# Patient Record
Sex: Female | Born: 1955 | Race: White | Hispanic: No | Marital: Married | Smoking: Current some day smoker
Health system: Southern US, Community
[De-identification: ages and names within clinical notes are randomized; demographics above are authoritative.]

## PROBLEM LIST (undated history)

## (undated) DIAGNOSIS — F419 Anxiety disorder, unspecified: Secondary | ICD-10-CM

## (undated) DIAGNOSIS — F32A Depression, unspecified: Secondary | ICD-10-CM

## (undated) DIAGNOSIS — F329 Major depressive disorder, single episode, unspecified: Secondary | ICD-10-CM

## (undated) HISTORY — PX: BREAST CYST ASPIRATION: SHX578

## (undated) HISTORY — PX: BREAST BIOPSY: SHX20

## (undated) HISTORY — PX: TUBAL LIGATION: SHX77

---

## 2006-03-19 ENCOUNTER — Emergency Department: Payer: Self-pay

## 2006-09-08 ENCOUNTER — Ambulatory Visit: Payer: Self-pay

## 2008-04-26 ENCOUNTER — Ambulatory Visit: Payer: Self-pay | Admitting: Internal Medicine

## 2008-06-14 ENCOUNTER — Ambulatory Visit: Payer: Self-pay | Admitting: Internal Medicine

## 2008-07-23 ENCOUNTER — Ambulatory Visit: Payer: Self-pay | Admitting: Emergency Medicine

## 2008-09-17 ENCOUNTER — Ambulatory Visit: Payer: Self-pay | Admitting: Family Medicine

## 2008-09-19 ENCOUNTER — Ambulatory Visit: Payer: Self-pay | Admitting: Family Medicine

## 2011-07-30 ENCOUNTER — Ambulatory Visit: Payer: Self-pay

## 2015-08-03 ENCOUNTER — Ambulatory Visit
Admission: EM | Admit: 2015-08-03 | Discharge: 2015-08-03 | Disposition: A | Discharge status: Home / Self Care | Payer: BLUE CROSS/BLUE SHIELD | Attending: Family Medicine | Admitting: Family Medicine

## 2015-08-03 DIAGNOSIS — B370 Candidal stomatitis: Secondary | ICD-10-CM

## 2015-08-03 LAB — RAPID INFLUENZA A&B ANTIGENS
Influenza A (ARMC): NOT DETECTED
Influenza B (ARMC): NOT DETECTED

## 2015-08-03 MED ORDER — NYSTATIN 100000 UNIT/ML MT SUSP: 500000.0000 [IU] | Freq: Four times a day (QID) | OROMUCOSAL | Status: DC

## 2015-08-03 NOTE — ED Notes (Signed)
Pt reports she has been sick for 1.5 weeks, felt feverish. Is requesting flu swab. Pt also experiencing mouth pain since Wednesday. Pt is concerned this is thrush.

## 2015-08-03 NOTE — ED Provider Notes (Signed)
CSN: [id number]     Arrival date & time 08/03/15  [id number] History   First MD Initiated Contact with Patient 08/03/15 [phone]     Chief Complaint  Patient presents with  . Oral Pain  . URI   (Consider location/radiation/quality/duration/timing/severity/associated sxs/prior Treatment) HPI: Patient presents today with symptoms of white irritative coating on her tongue and mouth. Patient states that she noticed these symptoms over the last few days. She has been sick with the "flu" for the last 2 weeks. She denies any chronic medical problems such as HIV. She states that she is not diabetic. She states that she gets annual HIV testing. She denies any current fever, significant weight loss, chest pain, shortness of breath, severe headache, cough, urinary symptoms.   No past medical history on file. No past surgical history on file. No family history on file. Social History  Substance Use Topics  . Smoking status: Current Some Day Smoker  . Smokeless tobacco: None  . Alcohol Use: No   OB History    No data available     Review of Systems: Negative except mentioned above.   Allergies  Review of patient's allergies indicates no known allergies.  Home Medications   Prior to Admission medications   Medication Sig Start Date End Date Taking? Authorizing Provider  Multiple Vitamin (MULTIVITAMIN) tablet Take 1 tablet by mouth daily.   Yes Historical Provider, MD  naproxen (NAPROSYN) 500 MG tablet Take 500 mg by mouth 2 (two) times daily with a meal.   Yes Historical Provider, MD  oxymetazoline (AFRIN) 0.05 % nasal spray Place 1 spray into both nostrils 2 (two) times daily.   Yes Historical Provider, MD  sertraline (ZOLOFT) 100 MG tablet Take 150 mg by mouth daily.   Yes Historical Provider, MD  nystatin (MYCOSTATIN) 100000 UNIT/ML suspension Use as directed 5 mLs (500,000 Units total) in the mouth or throat 4 (four) times daily. Swish and swallow. 08/03/15   Paulina Fusi, MD   Meds Ordered and  Administered this Visit  Medications - No data to display  BP 126/82 mmHg  Pulse 58  Temp(Src) 97.9 F (36.6 C) (Oral)  Resp 16  Ht 5\' 7"  (1.702 m)  Wt 130 lb (58.968 kg)  BMI 20.36 kg/m2  SpO2 99% No data found.   Physical Exam:   GENERAL: NAD HEENT: mild pharyngeal erythema, no exudate, no erythema of TMs, white coating on tongue, no cervical LAD  RESP: CTA B CARD: RRR NEURO: CN II-XII grossly intact   ED Course  Procedures (including critical care time)  Labs Review Labs Reviewed  RAPID INFLUENZA A&B ANTIGENS ([facility] ONLY)     MDM   1. Thrush, oral   Will treat with nystatin suspension, I have recommended that the patient follow up with a primary care physician to further evaluate why she would develop thrush. If her symptoms do persist or worsen I do recommend that she seek medical attention. Patient addresses understanding of following up with a primary care physician as soon as possible.    Paulina Fusi, MD 08/03/15 [phone]

## 2015-08-04 ENCOUNTER — Encounter: Payer: Self-pay | Admitting: Gynecology

## 2015-08-04 ENCOUNTER — Ambulatory Visit
Admission: EM | Admit: 2015-08-04 | Discharge: 2015-08-04 | Disposition: A | Discharge status: Home / Self Care | Payer: BLUE CROSS/BLUE SHIELD | Attending: Family Medicine | Admitting: Family Medicine

## 2015-08-04 DIAGNOSIS — N390 Urinary tract infection, site not specified: Secondary | ICD-10-CM

## 2015-08-04 LAB — URINALYSIS COMPLETE WITH MICROSCOPIC (ARMC ONLY)
BILIRUBIN URINE: NEGATIVE
GLUCOSE, UA: NEGATIVE mg/dL
Ketones, ur: NEGATIVE mg/dL
NITRITE: NEGATIVE
Protein, ur: 30 mg/dL — AB
SPECIFIC GRAVITY, URINE: 1.02 (ref 1.005–1.030)
pH: 5.5 (ref 5.0–8.0)

## 2015-08-04 MED ORDER — CIPROFLOXACIN HCL 500 MG PO TABS: 500.0000 mg | ORAL_TABLET | Freq: Two times a day (BID) | ORAL | Status: DC

## 2015-08-04 NOTE — ED Notes (Signed)
Patient c/o lower back pain and the urge to urinate x today.

## 2015-08-04 NOTE — ED Provider Notes (Signed)
CSN: [id number]     Arrival date & time 08/04/15  1349 History   First MD Initiated Contact with Patient 08/04/15 1518     Chief Complaint  Patient presents with  . Urinary Tract Infection   (Consider location/radiation/quality/duration/timing/severity/associated sxs/prior Treatment) HPI: Patient presents today with symptoms of urinary frequency and generalized lower back pain. Patient states that she has had some dysuria. She was seen here yesterday for thrush. She states that her nystatin has been helping her symptoms. Her urinary symptoms started today. She denies any fever, nausea, vomiting, severe abdominal pain. Patient denies being sexually active.  History reviewed. No pertinent past medical history. History reviewed. No pertinent past surgical history. No family history on file. Social History  Substance Use Topics  . Smoking status: Current Some Day Smoker  . Smokeless tobacco: None  . Alcohol Use: No   OB History    No data available     Review of Systems : Negative except mentioned above.  Allergies  Review of patient's allergies indicates no known allergies.  Home Medications   Prior to Admission medications   Medication Sig Start Date End Date Taking? Authorizing Provider  Multiple Vitamin (MULTIVITAMIN) tablet Take 1 tablet by mouth daily.   Yes Historical Provider, MD  naproxen (NAPROSYN) 500 MG tablet Take 500 mg by mouth 2 (two) times daily with a meal.   Yes Historical Provider, MD  nystatin (MYCOSTATIN) 100000 UNIT/ML suspension Use as directed 5 mLs (500,000 Units total) in the mouth or throat 4 (four) times daily. Swish and swallow. 08/03/15  Yes Paulina Fusi, MD  oxymetazoline (AFRIN) 0.05 % nasal spray Place 1 spray into both nostrils 2 (two) times daily.   Yes Historical Provider, MD  sertraline (ZOLOFT) 100 MG tablet Take 150 mg by mouth daily.   Yes Historical Provider, MD  ciprofloxacin (CIPRO) 500 MG tablet Take 1 tablet (500 mg total) by mouth every 12  (twelve) hours. 08/04/15   Paulina Fusi, MD   Meds Ordered and Administered this Visit  Medications - No data to display  BP 112/86 mmHg  Pulse 77  Temp(Src) 98.3 F (36.8 C) (Oral)  Resp 16  Ht 5\' 7"  (1.702 m)  Wt 130 lb (58.968 kg)  BMI 20.36 kg/m2  SpO2 98% No data found.   Physical Exam   GENERAL: NAD HEENT: no pharyngeal erythema, no exudate, no erythema of TMs, no cervical LAD RESP: CTA B CARD: RRR  ABD: +BS, mild suprapubic tenderness, no rebound or guarding, no flank tenderness NEURO: CN II-XII grossly intact   ED Course  Procedures (including critical care time)  Labs Review Labs Reviewed  URINALYSIS COMPLETEWITH MICROSCOPIC ([facility] ONLY) - Abnormal; Notable for the following:    APPearance CLOUDY (*)    Hgb urine dipstick 3+ (*)    Protein, ur 30 (*)    Leukocytes, UA 1+ (*)    Bacteria, UA FEW (*)    Squamous Epithelial / LPF 0-5 (*)    All other components within normal limits  URINE CULTURE     MDM   1. Urinary tract infection without hematuria, site unspecified    Will treat with Cipro, discussed with patient that if she needs more nystatin for her thrush she can call us for a refill, send urine for culture. I still advised the patient to make sure that she follows up with her primary care physician regarding the nature of etiology for her thrush for further evaluation. Seek medical attention if any acute problems.  Patient given excuse for work for one day.    Paulina Fusi, MD 08/04/15 [phone]

## 2015-08-06 LAB — URINE CULTURE: Culture: 30000

## 2016-01-23 ENCOUNTER — Other Ambulatory Visit: Payer: Self-pay | Admitting: Physician Assistant

## 2016-01-23 DIAGNOSIS — Z1231 Encounter for screening mammogram for malignant neoplasm of breast: Secondary | ICD-10-CM

## 2016-02-04 ENCOUNTER — Ambulatory Visit
Admission: RE | Admit: 2016-02-04 | Discharge: 2016-02-04 | Disposition: A | Discharge status: Home / Self Care | Payer: BLUE CROSS/BLUE SHIELD | Source: Ambulatory Visit | Attending: Physician Assistant | Admitting: Physician Assistant

## 2016-02-04 ENCOUNTER — Other Ambulatory Visit: Payer: Self-pay | Admitting: Physician Assistant

## 2016-02-04 DIAGNOSIS — Z1231 Encounter for screening mammogram for malignant neoplasm of breast: Secondary | ICD-10-CM

## 2016-03-26 ENCOUNTER — Encounter: Payer: Self-pay | Admitting: *Deleted

## 2016-03-27 ENCOUNTER — Ambulatory Visit: Payer: BLUE CROSS/BLUE SHIELD | Admitting: Anesthesiology

## 2016-03-27 ENCOUNTER — Encounter: Payer: Self-pay | Admitting: Anesthesiology

## 2016-03-27 ENCOUNTER — Encounter
Admission: RE | Disposition: A | Discharge status: Home / Self Care | Payer: Self-pay | Source: Ambulatory Visit | Attending: Gastroenterology

## 2016-03-27 ENCOUNTER — Ambulatory Visit
Admission: RE | Admit: 2016-03-27 | Discharge: 2016-03-27 | Disposition: A | Discharge status: Home / Self Care | Payer: BLUE CROSS/BLUE SHIELD | Source: Ambulatory Visit | Attending: Gastroenterology | Admitting: Gastroenterology

## 2016-03-27 DIAGNOSIS — D123 Benign neoplasm of transverse colon: Secondary | ICD-10-CM | POA: Diagnosis not present

## 2016-03-27 DIAGNOSIS — K621 Rectal polyp: Secondary | ICD-10-CM | POA: Insufficient documentation

## 2016-03-27 DIAGNOSIS — Z1211 Encounter for screening for malignant neoplasm of colon: Secondary | ICD-10-CM | POA: Insufficient documentation

## 2016-03-27 DIAGNOSIS — K5730 Diverticulosis of large intestine without perforation or abscess without bleeding: Secondary | ICD-10-CM | POA: Diagnosis not present

## 2016-03-27 DIAGNOSIS — F17200 Nicotine dependence, unspecified, uncomplicated: Secondary | ICD-10-CM | POA: Diagnosis not present

## 2016-03-27 DIAGNOSIS — Z791 Long term (current) use of non-steroidal anti-inflammatories (NSAID): Secondary | ICD-10-CM | POA: Insufficient documentation

## 2016-03-27 DIAGNOSIS — Z79899 Other long term (current) drug therapy: Secondary | ICD-10-CM | POA: Insufficient documentation

## 2016-03-27 DIAGNOSIS — J410 Simple chronic bronchitis: Secondary | ICD-10-CM | POA: Diagnosis not present

## 2016-03-27 DIAGNOSIS — F329 Major depressive disorder, single episode, unspecified: Secondary | ICD-10-CM | POA: Insufficient documentation

## 2016-03-27 DIAGNOSIS — F419 Anxiety disorder, unspecified: Secondary | ICD-10-CM | POA: Insufficient documentation

## 2016-03-27 HISTORY — PX: COLONOSCOPY WITH PROPOFOL: SHX5780

## 2016-03-27 HISTORY — DX: Major depressive disorder, single episode, unspecified: F32.9

## 2016-03-27 HISTORY — DX: Depression, unspecified: F32.A

## 2016-03-27 HISTORY — DX: Anxiety disorder, unspecified: F41.9

## 2016-03-27 SURGERY — COLONOSCOPY WITH PROPOFOL
Anesthesia: General

## 2016-03-27 MED ORDER — SPOT INK MARKER SYRINGE KIT
PACK | SUBMUCOSAL | Status: DC | PRN
  Administered 2016-03-27: 3 mL via SUBMUCOSAL

## 2016-03-27 MED ORDER — PROPOFOL 10 MG/ML IV BOLUS
INTRAVENOUS | Status: DC | PRN
  Administered 2016-03-27: 50 mg via INTRAVENOUS

## 2016-03-27 MED ORDER — PHENYLEPHRINE HCL 10 MG/ML IJ SOLN
INTRAMUSCULAR | Status: DC | PRN
  Administered 2016-03-27 (×2): 100 ug via INTRAVENOUS

## 2016-03-27 MED ORDER — SODIUM CHLORIDE 0.9 % IV SOLN
INTRAVENOUS | Status: DC
  Administered 2016-03-27: 17:00:00 via INTRAVENOUS
  Administered 2016-03-27: 1000 mL via INTRAVENOUS
  Administered 2016-03-27: 16:00:00 via INTRAVENOUS

## 2016-03-27 MED ORDER — PROPOFOL 500 MG/50ML IV EMUL
INTRAVENOUS | Status: DC | PRN
  Administered 2016-03-27: 150 ug/kg/min via INTRAVENOUS

## 2016-03-27 MED ORDER — LIDOCAINE HCL (CARDIAC) 20 MG/ML IV SOLN
INTRAVENOUS | Status: DC | PRN
  Administered 2016-03-27: 60 mg via INTRAVENOUS

## 2016-03-27 MED ORDER — MIDAZOLAM HCL 2 MG/2ML IJ SOLN
INTRAMUSCULAR | Status: DC | PRN
  Administered 2016-03-27: 1 mg via INTRAVENOUS

## 2016-03-27 MED ORDER — SODIUM CHLORIDE 0.9 % IV SOLN: INTRAVENOUS | Status: DC

## 2016-03-27 NOTE — Anesthesia Postprocedure Evaluation (Signed)
Anesthesia Post Note  Patient: Melissa Peters  Procedure(s) Performed: Procedure(s) (LRB): COLONOSCOPY WITH PROPOFOL (N/A)  Patient location during evaluation: Endoscopy Anesthesia Type: General Level of consciousness: awake and alert Pain management: pain level controlled Vital Signs Assessment: post-procedure vital signs reviewed and stable Respiratory status: spontaneous breathing and respiratory function stable Cardiovascular status: stable Anesthetic complications: no    Last Vitals:  Vitals:   03/27/16 1713 03/27/16 1724  BP: 111/68 104/67  Pulse: 79 70  Resp: 20 18  Temp: (!) 35.6 C     Last Pain:  Vitals:   03/27/16 1713  TempSrc: Tympanic                 KEPHART,WILLIAM K

## 2016-03-27 NOTE — Transfer of Care (Signed)
Immediate Anesthesia Transfer of Care Note  Patient: Melissa Peters  Procedure(s) Performed: Procedure(s): COLONOSCOPY WITH PROPOFOL (N/A)  Patient Location: Endoscopy Unit  Anesthesia Type:General  Level of Consciousness: awake, alert , oriented and patient cooperative  Airway & Oxygen Therapy: Patient Spontanous Breathing and Patient connected to nasal cannula oxygen  Post-op Assessment: Report given to RN, Post -op Vital signs reviewed and stable and Patient moving all extremities X 4  Post vital signs: Reviewed and stable  Last Vitals:  Vitals:   03/27/16 1523  BP: 134/83  Pulse: 71  Resp: 20  Temp: (!) 36.1 C    Last Pain:  Vitals:   03/27/16 1523  TempSrc: Tympanic         Complications: No apparent anesthesia complications

## 2016-03-27 NOTE — Op Note (Signed)
[facility] Gastroenterology Patient Name: Melissa Peters Procedure Date: 03/27/2016 3:51 PM MRN: [id number] Account #: [id number] Date of Birth: [birth year] Admit Type: Outpatient Age: 60 Room: [facility] ENDO ROOM 4 Gender: Female Note Status: Finalized Procedure:            Colonoscopy Indications:          Screening for colorectal malignant neoplasm, This is                        the patient's first colonoscopy Providers:            Lollie Sails, MD Referring MD:         Precious Bard, MD (Referring MD) Medicines:            Monitored Anesthesia Care Complications:        No immediate complications. Procedure:            Pre-Anesthesia Assessment:                       - ASA Grade Assessment: II - A patient with mild                        systemic disease.                       After obtaining informed consent, the colonoscope was                        passed under direct vision. Throughout the procedure,                        the patient's blood pressure, pulse, and oxygen                        saturations were monitored continuously. The                        Colonoscope was introduced through the anus and                        advanced to the the cecum, identified by appendiceal                        orifice and ileocecal valve. The colonoscopy was                        unusually difficult due to a tortuous colon. Successful                        completion of the procedure was aided by changing the                        patient to a supine position, changing the patient to a                        prone position, using manual pressure and withdrawing                        and reinserting the scope. The patient tolerated the  procedure well. The quality of the bowel preparation                        was good. Findings:      Multiple small and large-mouthed diverticula were found in the sigmoid       colon, descending  colon, transverse colon and ascending colon.      A 18 mm polyp was found in the hepatic flexure. The polyp was sessile.       The polyp was removed with a cold biopsy forceps, The polyp was removed       with a cold snare, The polyp was removed with a lift and cut technique       using a cold snare and The polyp was removed with a piecemeal technique       using a cold snare. Resection and retrieval were complete. Two       hemostatic clips were successfully placed.      A 1 mm polyp was found in the rectum. The polyp was sessile. The polyp       was removed with a cold biopsy forceps. Resection and retrieval were       complete. Impression:           - Diverticulosis in the sigmoid colon, in the                        descending colon, in the transverse colon and in the                        ascending colon.                       - One 18 mm polyp at the hepatic flexure, removed with                        a cold biopsy forceps, removed with a cold snare,                        removed using lift and cut and a cold snare and removed                        piecemeal using a cold snare. Resected and retrieved.                        Clips were placed. Recommendation:       - Discharge patient to home.                       - Await pathology results.                       - Telephone GI clinic for pathology results in 1 week.                       - Clear liquid diet today.                       - Full liquid diet for 2 days, then advance as                        tolerated to low residue  diet for 5 days. Procedure Code(s):    --- Professional ---                       [phone], Colonoscopy, flexible; with removal of tumor(s),                        polyp(s), or other lesion(s) by snare technique                       45380, 88, Colonoscopy, flexible; with biopsy, single                        or multiple Diagnosis Code(s):    --- Professional ---                       Z12.11, Encounter  for screening for malignant neoplasm                        of colon                       D12.3, Benign neoplasm of transverse colon (hepatic                        flexure or splenic flexure)                       K57.30, Diverticulosis of large intestine without                        perforation or abscess without bleeding CPT copyright 2016 [employer]. All rights reserved. The codes documented in this report are preliminary and upon coder review may  be revised to meet current compliance requirements. Lollie Sails, MD 03/27/2016 5:15:51 PM This report has been signed electronically. Number of Addenda: 0 Note Initiated On: 03/27/2016 3:51 PM Scope Withdrawal Time: 0 hours 54 minutes 22 seconds  Total Procedure Duration: 1 hour 8 minutes 11 seconds       [facility]

## 2016-03-27 NOTE — H&P (Signed)
Outpatient short stay form Pre-procedure 03/27/2016 3:44 PM Lollie Sails MD  Primary Physician: Jackelyn Poling PA  Reason for visit:  Screening colonoscopy  History of present illness:  Patient is a 60 year old female presenting today for her initial screening colonoscopy. She tolerated her prep well. She takes no aspirin or blood thinning agents.    Current Facility-Administered Medications:  .  0.9 %  sodium chloride infusion, , Intravenous, Continuous, Lollie Sails, MD, Last Rate: 20 mL/hr at 03/27/16 1541, 1,000 mL at 03/27/16 1541 .  0.9 %  sodium chloride infusion, , Intravenous, Continuous, Lollie Sails, MD  Prescriptions Prior to Admission  Medication Sig Dispense Refill Last Dose  . albuterol (PROVENTIL HFA;VENTOLIN HFA) 108 (90 Base) MCG/ACT inhaler Inhale 2 puffs into the lungs every 4 (four) hours as needed for wheezing or shortness of breath.   03/27/2016 at 0730  . clonazePAM (KLONOPIN) 0.5 MG tablet Take 0.5 mg by mouth 2 (two) times daily as needed for anxiety.   Past Week at Unknown time  . ERGOCALCIFEROL PO Take 1 tablet by mouth daily.     . Multiple Vitamin (MULTIVITAMIN) tablet Take 1 tablet by mouth daily.   Past Week at Unknown time  . naproxen (NAPROSYN) 500 MG tablet Take 500 mg by mouth 2 (two) times daily with a meal.   Past Week at Unknown time  . sertraline (ZOLOFT) 100 MG tablet Take 150 mg by mouth daily.   03/27/2016 at 1000  . ciprofloxacin (CIPRO) 500 MG tablet Take 1 tablet (500 mg total) by mouth every 12 (twelve) hours. 14 tablet 0   . diclofenac sodium (VOLTAREN) 1 % GEL Apply topically 4 (four) times daily.   Not Taking at Unknown time  . meloxicam (MOBIC) 15 MG tablet Take 15 mg by mouth daily.   Not Taking at Unknown time  . methocarbamol (ROBAXIN) 500 MG tablet Take 500 mg by mouth 3 (three) times daily.   Not Taking at Unknown time  . nystatin (MYCOSTATIN) 100000 UNIT/ML suspension Use as directed 5 mLs (500,000 Units total) in the  mouth or throat 4 (four) times daily. Swish and swallow. 60 mL 0   . oxymetazoline (AFRIN) 0.05 % nasal spray Place 1 spray into both nostrils 2 (two) times daily.        No Known Allergies   Past Medical History:  Diagnosis Date  . Anxiety   . Depression     Review of systems:      Physical Exam    Heart and lungs: Regular rate and rhythm without rub or gallop, lungs are bilaterally clear.    HEENT: Normocephalic atraumatic eyes are anicteric    Other:     Pertinant exam for procedure: Soft nontender nondistended bowel sounds positive normoactive.    Planned proceedures: Colonoscopy with indicated procedures I have discussed the risks benefits and complications of procedures to include not limited to bleeding, infection, perforation and the risk of sedation and the patient wishes to proceed.    Lollie Sails, MD Gastroenterology 03/27/2016  3:44 PM

## 2016-03-27 NOTE — Anesthesia Preprocedure Evaluation (Signed)
Anesthesia Evaluation  Patient identified by MRN, date of birth, ID band Patient awake    Reviewed: Allergy & Precautions, NPO status , Patient's Chart, lab work & pertinent test results  Airway Mallampati: II       Dental  (+) Chipped   Pulmonary Current Smoker,  Smokers cough   Pulmonary exam normal        Cardiovascular negative cardio ROS Normal cardiovascular exam     Neuro/Psych Anxiety Depression    GI/Hepatic negative GI ROS, Neg liver ROS,   Endo/Other  negative endocrine ROS  Renal/GU negative Renal ROS  negative genitourinary   Musculoskeletal negative musculoskeletal ROS (+)   Abdominal Normal abdominal exam  (+)   Peds negative pediatric ROS (+)  Hematology negative hematology ROS (+)   Anesthesia Other Findings   Reproductive/Obstetrics                             Anesthesia Physical Anesthesia Plan  ASA: II  Anesthesia Plan: General   Post-op Pain Management:    Induction: Intravenous  Airway Management Planned: Nasal Cannula  Additional Equipment:   Intra-op Plan:   Post-operative Plan:   Informed Consent: I have reviewed the patients History and Physical, chart, labs and discussed the procedure including the risks, benefits and alternatives for the proposed anesthesia with the patient or authorized representative who has indicated his/her understanding and acceptance.   Dental advisory given  Plan Discussed with: CRNA and Surgeon  Anesthesia Plan Comments:         Anesthesia Quick Evaluation

## 2016-03-30 ENCOUNTER — Encounter: Payer: Self-pay | Admitting: Gastroenterology

## 2016-03-31 LAB — SURGICAL PATHOLOGY

## 2016-12-25 ENCOUNTER — Other Ambulatory Visit: Payer: Self-pay | Admitting: Physician Assistant

## 2016-12-25 DIAGNOSIS — Z1231 Encounter for screening mammogram for malignant neoplasm of breast: Secondary | ICD-10-CM

## 2017-02-04 ENCOUNTER — Inpatient Hospital Stay: Admission: RE | Admit: 2017-02-04 | Payer: BLUE CROSS/BLUE SHIELD | Source: Ambulatory Visit

## 2017-07-05 ENCOUNTER — Ambulatory Visit
Admission: EM | Admit: 2017-07-05 | Discharge: 2017-07-05 | Disposition: A | Discharge status: Home / Self Care | Payer: BLUE CROSS/BLUE SHIELD | Attending: Family Medicine | Admitting: Family Medicine

## 2017-07-05 ENCOUNTER — Other Ambulatory Visit: Payer: Self-pay

## 2017-07-05 ENCOUNTER — Encounter: Payer: Self-pay | Admitting: Emergency Medicine

## 2017-07-05 DIAGNOSIS — R059 Cough, unspecified: Secondary | ICD-10-CM

## 2017-07-05 DIAGNOSIS — J9801 Acute bronchospasm: Secondary | ICD-10-CM | POA: Diagnosis not present

## 2017-07-05 DIAGNOSIS — R05 Cough: Secondary | ICD-10-CM

## 2017-07-05 MED ORDER — ALBUTEROL SULFATE HFA 108 (90 BASE) MCG/ACT IN AERS: 2.0000 | INHALATION_SPRAY | RESPIRATORY_TRACT | 0 refills | Status: AC | PRN

## 2017-07-05 MED ORDER — BENZONATATE 100 MG PO CAPS: 100.0000 mg | ORAL_CAPSULE | Freq: Three times a day (TID) | ORAL | 0 refills | Status: DC | PRN

## 2017-07-05 MED ORDER — GUAIFENESIN-CODEINE 100-10 MG/5ML PO SOLN: 5.0000 mL | Freq: Every evening | ORAL | 0 refills | Status: DC | PRN

## 2017-07-05 MED ORDER — DOXYCYCLINE HYCLATE 100 MG PO CAPS: 100.0000 mg | ORAL_CAPSULE | Freq: Two times a day (BID) | ORAL | 0 refills | Status: DC

## 2017-07-05 NOTE — ED Provider Notes (Signed)
[facility] ____________________________________________  Time seen: Approximately 4:50 PM  I have reviewed the triage vital signs and the nursing notes.   HISTORY  Chief Complaint Cough   HPI Melissa Peters is a 62 y.o. female presenting for evaluation of 2 weeks of cough and chest congestion.  States initially at symptom onset she had much more runny nose, nasal congestion, and felt like it was more of a cold.  States nasal congestion sinus pressure symptoms have fully resolved but continues with cough and chest congestion.  States cough is worse at night and first thing in the morning, but continues throughout the day.  States cough is sometimes productive of thick mucus, but also often dry and tacky.  States does intermittently hear herself wheeze.  She is a smoker.  States has had pneumonia in the past, not recurrently.  Denies history of known bronchitis.  Reports symptoms have been unresolved with over-the-counter multiple cough and congestion agents.  States cough is disrupting her sleep.  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  Reports continues to eat and drink well.  Denies other aggravating or alleviating factors.  Denies chest pain, shortness of breath, hemoptysis, abdominal pain, extremity pain, extremity swelling or rash. Denies recent sickness. Denies recent antibiotic use.  Denies cardiac history.  Denies renal insufficiency.  Marinda Elk, MD: PCP   Past Medical History:  Diagnosis Date  . Anxiety   . Depression     There are no active problems to display for this patient.   Past Surgical History:  Procedure Laterality Date  . BREAST BIOPSY Right    neg  . BREAST CYST ASPIRATION Left    fna-neg  . COLONOSCOPY WITH PROPOFOL N/A 03/27/2016   Procedure: COLONOSCOPY WITH PROPOFOL;  Surgeon: Lollie Sails, MD;  Location: [facility] ENDOSCOPY;  Service: Endoscopy;  Laterality: N/A;    . TUBAL LIGATION       No current facility-administered medications for this encounter.   Current Outpatient Medications:  .  albuterol (PROVENTIL HFA;VENTOLIN HFA) 108 (90 Base) MCG/ACT inhaler, Inhale 2 puffs into the lungs every 4 (four) hours as needed for wheezing or shortness of breath., Disp: , Rfl:  .  clonazePAM (KLONOPIN) 0.5 MG tablet, Take 0.5 mg by mouth 2 (two) times daily as needed for anxiety., Disp: , Rfl:  .  Multiple Vitamin (MULTIVITAMIN) tablet, Take 1 tablet by mouth daily., Disp: , Rfl:  .  oxymetazoline (AFRIN) 0.05 % nasal spray, Place 1 spray into both nostrils 2 (two) times daily., Disp: , Rfl:  .  sertraline (ZOLOFT) 100 MG tablet, Take 150 mg by mouth daily., Disp: , Rfl:  .  albuterol (PROVENTIL HFA;VENTOLIN HFA) 108 (90 Base) MCG/ACT inhaler, Inhale 2 puffs into the lungs every 4 (four) hours as needed for wheezing., Disp: 1 Inhaler, Rfl: 0 .  benzonatate (TESSALON PERLES) 100 MG capsule, Take 1 capsule (100 mg total) by mouth 3 (three) times daily as needed for cough., Disp: 15 capsule, Rfl: 0 .  diclofenac sodium (VOLTAREN) 1 % GEL, Apply topically 4 (four) times daily., Disp: , Rfl:  .  doxycycline (VIBRAMYCIN) 100 MG capsule, Take 1 capsule (100 mg total) by mouth 2 (two) times daily., Disp: 20 capsule, Rfl: 0 .  ERGOCALCIFEROL PO, Take 1 tablet by mouth daily., Disp: , Rfl:  .  guaiFENesin-codeine 100-10 MG/5ML syrup, Take 5 mLs by mouth at bedtime as needed for cough. Do not  drive or operate machinery as can cause drowsiness., Disp: 75 mL, Rfl: 0 .  naproxen (NAPROSYN) 500 MG tablet, Take 500 mg by mouth 2 (two) times daily with a meal., Disp: , Rfl:  .  nystatin (MYCOSTATIN) 100000 UNIT/ML suspension, Use as directed 5 mLs (500,000 Units total) in the mouth or throat 4 (four) times daily. Swish and swallow., Disp: 60 mL, Rfl: 0  Allergies Patient has no known allergies.  Family History  Problem Relation Age of Onset  . Dementia Mother   . Alcohol  abuse Mother   . Alcohol abuse Sister   . CAD Sister   . Alcohol abuse Brother   . CAD Brother   . Syncope episode Brother   . Diabetes Mellitus I Daughter   . Alzheimer's disease Maternal Grandmother   . CAD Paternal Grandfather     Social History Social History   Tobacco Use  . Smoking status: Current Some Day Smoker    Packs/day: 0.50    Years: 40.00    Pack years: 20.00    Types: Cigarettes  . Smokeless tobacco: Never Used  Substance Use Topics  . Alcohol use: No  . Drug use: No    Review of Systems Constitutional: No fever/chills. ENT: No sore throat. Cardiovascular: Denies chest pain. Respiratory: Denies shortness of breath. Gastrointestinal: No abdominal pain.  No nausea, no vomiting.  No diarrhea.  Genitourinary: Negative for dysuria. Musculoskeletal: Negative for back pain. Skin: Negative for rash.   ____________________________________________   PHYSICAL EXAM:  VITAL SIGNS: ED Triage Vitals  Enc Vitals Group     BP 07/05/17 1611 110/65     Pulse Rate 07/05/17 1611 72     Resp 07/05/17 1611 14     Temp 07/05/17 1611 98.3 F (36.8 C)     Temp Source 07/05/17 1611 Oral     SpO2 07/05/17 1611 100 %     Weight 07/05/17 1608 125 lb (56.7 kg)     Height 07/05/17 1608 5\' 7"  (1.702 m)     Head Circumference --      Peak Flow --      Pain Score 07/05/17 1608 0     Pain Loc --      Pain Edu? --      Excl. in [place]? --     Constitutional: Alert and oriented. Well appearing and in no acute distress. Eyes: Conjunctivae are normal.  ENT      Head: Normocephalic and atraumatic.      Ears:  Nontender, no erythema, normal TMs bilaterally.      Nose: No congestion.         Mouth/Throat: Mucous membranes are moist. Oropharynx non-erythematous. No tonsillar swelling or exudate.  Neck: No stridor. Supple without meningismus.  Hematological/Lymphatic/Immunilogical: No cervical lymphadenopathy. Cardiovascular: Normal rate, regular rhythm. Grossly normal heart  sounds. Good peripheral circulation. Respiratory: Normal respiratory effort without tachypnea nor retractions. Breath sounds are clear and equal bilaterally. No wheezes, rales, rhonchi. Dry intermittent cough with bronchospasm.  Speaks in complete sentences. Musculoskeletal: No midline cervical, thoracic or lumbar tenderness to palpation.   Neurologic:  Normal speech and language. No gross focal neurologic deficits are appreciated. Speech is normal. No gait instability.  Skin:  Skin is warm, dry and intact. No rash noted. Psychiatric: Mood and affect are normal. Speech and behavior are normal. Patient exhibits appropriate insight and judgment.    ___________________________________________   LABS (all labs ordered are listed, but only abnormal results are displayed)  Labs Reviewed -  No data to display ____________________________________________   PROCEDURES Procedures    INITIAL IMPRESSION / ASSESSMENT AND PLAN / ED COURSE  Pertinent labs & imaging results that were available during my care of the patient were reviewed by me and considered in my medical decision making (see chart for details).  Well-appearing patient.  No acute distress.  Suspect recent viral upper respiratory infection, concern for secondary infection.  Will empirically treat patient with oral prednisone taper, as needed albuterol inhaler, PRN Tessalon Perles, as needed guaifenesin with codeine and oral doxycycline.  Encourage rest, fluids, supportive care.  Discussed in detail with patient, and defer chest x-ray at this time, patient agrees.  Encourage reevaluation for no improvement or continued complaints and imaging at that time. Discussed indication, risks and benefits of medications with patient.  Discussed follow up with Primary care physician this week. Discussed follow up and return parameters including no resolution or any worsening concerns. Patient verbalized understanding and agreed to plan.    ____________________________________________   FINAL CLINICAL IMPRESSION(S) / ED DIAGNOSES  Final diagnoses:  Cough  Bronchospasm     ED Discharge Orders        Ordered    doxycycline (VIBRAMYCIN) 100 MG capsule  2 times daily     07/05/17 1654    albuterol (PROVENTIL HFA;VENTOLIN HFA) 108 (90 Base) MCG/ACT inhaler  Every 4 hours PRN     07/05/17 1654    benzonatate (TESSALON PERLES) 100 MG capsule  3 times daily PRN     07/05/17 1654    guaiFENesin-codeine 100-10 MG/5ML syrup  At bedtime PRN     07/05/17 1654       Note: This dictation was prepared with Dragon dictation along with smaller phrase technology. Any transcriptional errors that result from this process are unintentional.         Marylene Land, NP 07/05/17 1715

## 2017-07-05 NOTE — Discharge Instructions (Signed)
Take medication as prescribed. Rest. Drink plenty of fluids.  ° °Follow up with your primary care physician this week as needed. Return to Urgent care for new or worsening concerns.  ° °

## 2017-07-05 NOTE — ED Triage Notes (Signed)
Patient c/o cough and chest congestion for 2 weeks.  Patient denies fevers.  

## 2017-12-01 ENCOUNTER — Ambulatory Visit
Admission: RE | Admit: 2017-12-01 | Discharge: 2017-12-01 | Disposition: A | Discharge status: Home / Self Care | Payer: BLUE CROSS/BLUE SHIELD | Source: Ambulatory Visit | Attending: Physician Assistant | Admitting: Physician Assistant

## 2017-12-01 DIAGNOSIS — Z1231 Encounter for screening mammogram for malignant neoplasm of breast: Secondary | ICD-10-CM | POA: Insufficient documentation

## 2018-07-10 ENCOUNTER — Other Ambulatory Visit: Payer: Self-pay

## 2018-07-10 ENCOUNTER — Ambulatory Visit
Admission: EM | Admit: 2018-07-10 | Discharge: 2018-07-10 | Disposition: A | Discharge status: Home / Self Care | Payer: BLUE CROSS/BLUE SHIELD | Attending: Emergency Medicine | Admitting: Emergency Medicine

## 2018-07-10 DIAGNOSIS — B370 Candidal stomatitis: Secondary | ICD-10-CM

## 2018-07-10 MED ORDER — NYSTATIN 100000 UNIT/ML MT SUSP: 500000.0000 [IU] | Freq: Four times a day (QID) | OROMUCOSAL | 0 refills | Status: DC

## 2018-07-10 NOTE — ED Provider Notes (Signed)
[facility]    CSN: [id number] Arrival date & time: 07/10/18  1133     History   Chief Complaint Chief Complaint  Patient presents with  . Sore Throat    HPI Melissa Peters is a 63 y.o. female.   HPI  63 year old female Zentz with small thrush.  She has noticed white marks on her tongue and cheeks.  Has had this in the past treated with nystatin swish and swallow.  Mouth is sore.  She has prediabetes but has never been diagnosed with diabetes.  Is not have any reason for immunocompromise.  She does wear upper dentures.  Tried taking 1 of her mother's Keflex and she thought that it may have helped somewhat.        Past Medical History:  Diagnosis Date  . Anxiety   . Depression     There are no active problems to display for this patient.   Past Surgical History:  Procedure Laterality Date  . BREAST BIOPSY Right    neg  . BREAST CYST ASPIRATION Left    fna-neg  . COLONOSCOPY WITH PROPOFOL N/A 03/27/2016   Procedure: COLONOSCOPY WITH PROPOFOL;  Surgeon: Lollie Sails, MD;  Location: [facility] ENDOSCOPY;  Service: Endoscopy;  Laterality: N/A;  . TUBAL LIGATION      OB History   No obstetric history on file.      Home Medications    Prior to Admission medications   Medication Sig Start Date End Date Taking? Authorizing Provider  albuterol (PROVENTIL HFA;VENTOLIN HFA) 108 (90 Base) MCG/ACT inhaler Inhale 2 puffs into the lungs every 4 (four) hours as needed for wheezing. 07/05/17   Marylene Land, NP  clonazePAM (KLONOPIN) 0.5 MG tablet Take 0.5 mg by mouth 2 (two) times daily as needed for anxiety.    [provider]  ERGOCALCIFEROL PO Take 1 tablet by mouth daily.    [provider]  Multiple Vitamin (MULTIVITAMIN) tablet Take 1 tablet by mouth daily.    [provider]  naproxen (NAPROSYN) 500 MG tablet Take 500 mg by mouth 2 (two) times daily with a meal.    [provider]  nystatin (MYCOSTATIN) 100000 UNIT/ML  suspension Use as directed 5 mLs (500,000 Units total) in the mouth or throat 4 (four) times daily. Swish and swallow. 07/10/18   Lorin Picket, PA-C  oxymetazoline (AFRIN) 0.05 % nasal spray Place 1 spray into both nostrils 2 (two) times daily.    [provider]  sertraline (ZOLOFT) 100 MG tablet Take 150 mg by mouth daily.    [provider]    Family History Family History  Problem Relation Age of Onset  . Dementia Mother   . Alcohol abuse Mother   . Alcohol abuse Sister   . CAD Sister   . Alcohol abuse Brother   . CAD Brother   . Syncope episode Brother   . Diabetes Mellitus I Daughter   . Alzheimer's disease Maternal Grandmother   . CAD Paternal Grandfather   . Breast cancer Neg Hx     Social History Social History   Tobacco Use  . Smoking status: Current Some Day Smoker    Packs/day: 0.50    Years: 40.00    Pack years: 20.00    Types: Cigarettes  . Smokeless tobacco: Never Used  Substance Use Topics  . Alcohol use: No    Comment: rare  . Drug use: No     Allergies   Patient has no  known allergies.   Review of Systems Review of Systems  Constitutional: Positive for appetite change. Negative for activity change, chills, diaphoresis, fatigue and fever.  HENT: Positive for mouth sores.   All other systems reviewed and are negative.    Physical Exam Triage Vital Signs ED Triage Vitals  Enc Vitals Group     BP 07/10/18 1149 116/86     Pulse Rate 07/10/18 1149 62     Resp 07/10/18 1149 16     Temp 07/10/18 1149 98.1 F (36.7 C)     Temp Source 07/10/18 1149 Oral     SpO2 07/10/18 1149 99 %     Weight 07/10/18 1151 130 lb (59 kg)     Height 07/10/18 1151 5\' 7"  (1.702 m)     Head Circumference --      Peak Flow --      Pain Score 07/10/18 1151 7     Pain Loc --      Pain Edu? --      Excl. in [place]? --    No data found.  Updated Vital Signs BP 116/86 (BP Location: Left Arm)   Pulse 62   Temp 98.1 F (36.7 C) (Oral)   Resp 16    Ht 5\' 7"  (1.702 m)   Wt 130 lb (59 kg)   SpO2 99%   BMI 20.36 kg/m   Visual Acuity Right Eye Distance:   Left Eye Distance:   Bilateral Distance:    Right Eye Near:   Left Eye Near:    Bilateral Near:     Physical Exam Vitals signs and nursing note reviewed.  Constitutional:      General: She is not in acute distress.    Appearance: She is well-developed and normal weight. She is not ill-appearing, toxic-appearing or diaphoretic.  HENT:     Head: Normocephalic and atraumatic.     Right Ear: Tympanic membrane normal.     Left Ear: Tympanic membrane and ear canal normal.     Nose: No congestion or rhinorrhea.     Mouth/Throat:     Mouth: Mucous membranes are moist. Oral lesions present.     Pharynx: No pharyngeal swelling, oropharyngeal exudate, posterior oropharyngeal erythema or uvula swelling.     Comments: Patient has a creamy white coating to her tongue and buccal surface of the cheeks mostly in the nares near the back molars.  Tongue is slightly red. Neck:     Musculoskeletal: Normal range of motion.  Skin:    General: Skin is warm and dry.  Neurological:     General: No focal deficit present.     Mental Status: She is alert and oriented to person, place, and time.  Psychiatric:        Mood and Affect: Mood normal.        Behavior: Behavior normal.      UC Treatments / Results  Labs (all labs ordered are listed, but only abnormal results are displayed) Labs Reviewed - No data to display  EKG None  Radiology No results found.  Procedures Procedures (including critical care time)  Medications Ordered in UC Medications - No data to display  Initial Impression / Assessment and Plan / UC Course  I have reviewed the triage vital signs and the nursing notes.  Pertinent labs & imaging results that were available during my care of the patient were reviewed by me and considered in my medical decision making (see chart for details).   Will re-prescribe  the  nystatin for swish and swallow.  Is to follow-up with her primary care physician to find causes.  May be dentures or possibly her prediabetes.  At any rate she will make an appointment with her primary care   Final Clinical Impressions(s) / UC Diagnoses   Final diagnoses:  Thrush, oral   Discharge Instructions   None    ED Prescriptions    Medication Sig Dispense Auth. Provider   nystatin (MYCOSTATIN) 100000 UNIT/ML suspension Use as directed 5 mLs (500,000 Units total) in the mouth or throat 4 (four) times daily. Swish and swallow. 60 mL Lorin Picket, PA-C     Controlled Substance Prescriptions [place] Controlled Substance Registry consulted? Not Applicable   Lorin Picket, PA-C 07/10/18 1813

## 2018-07-10 NOTE — ED Triage Notes (Signed)
Pt reports at first she thought she had strep but then decided it was thrush. White all over her mouth. Took one of her mother's antibiotics and it has started to help. (Keflex). Mouth is still sore with some white on tongue

## 2018-08-27 ENCOUNTER — Encounter: Payer: Self-pay | Admitting: Emergency Medicine

## 2018-08-27 ENCOUNTER — Ambulatory Visit
Admission: EM | Admit: 2018-08-27 | Discharge: 2018-08-27 | Disposition: A | Discharge status: Home / Self Care | Payer: BLUE CROSS/BLUE SHIELD | Source: Home / Self Care | Attending: Family Medicine | Admitting: Family Medicine

## 2018-08-27 ENCOUNTER — Other Ambulatory Visit: Payer: Self-pay

## 2018-08-27 ENCOUNTER — Ambulatory Visit (INDEPENDENT_AMBULATORY_CARE_PROVIDER_SITE_OTHER): Payer: BLUE CROSS/BLUE SHIELD

## 2018-08-27 ENCOUNTER — Emergency Department: Payer: BLUE CROSS/BLUE SHIELD

## 2018-08-27 ENCOUNTER — Inpatient Hospital Stay
Admission: EM | Admit: 2018-08-27 | Discharge: 2018-08-30 | DRG: 193 | Disposition: A | Discharge status: Home / Self Care | Payer: BLUE CROSS/BLUE SHIELD | Source: Ambulatory Visit | Attending: Internal Medicine | Admitting: Internal Medicine

## 2018-08-27 DIAGNOSIS — F17210 Nicotine dependence, cigarettes, uncomplicated: Secondary | ICD-10-CM | POA: Diagnosis present

## 2018-08-27 DIAGNOSIS — J9601 Acute respiratory failure with hypoxia: Secondary | ICD-10-CM | POA: Diagnosis present

## 2018-08-27 DIAGNOSIS — Z8249 Family history of ischemic heart disease and other diseases of the circulatory system: Secondary | ICD-10-CM

## 2018-08-27 DIAGNOSIS — J181 Lobar pneumonia, unspecified organism: Secondary | ICD-10-CM | POA: Diagnosis present

## 2018-08-27 DIAGNOSIS — Z716 Tobacco abuse counseling: Secondary | ICD-10-CM | POA: Diagnosis not present

## 2018-08-27 DIAGNOSIS — F329 Major depressive disorder, single episode, unspecified: Secondary | ICD-10-CM | POA: Diagnosis present

## 2018-08-27 DIAGNOSIS — Z6820 Body mass index (BMI) 20.0-20.9, adult: Secondary | ICD-10-CM

## 2018-08-27 DIAGNOSIS — M791 Myalgia, unspecified site: Secondary | ICD-10-CM

## 2018-08-27 DIAGNOSIS — J189 Pneumonia, unspecified organism: Secondary | ICD-10-CM

## 2018-08-27 DIAGNOSIS — Z833 Family history of diabetes mellitus: Secondary | ICD-10-CM

## 2018-08-27 DIAGNOSIS — Z79899 Other long term (current) drug therapy: Secondary | ICD-10-CM | POA: Diagnosis not present

## 2018-08-27 DIAGNOSIS — R51 Headache: Secondary | ICD-10-CM

## 2018-08-27 DIAGNOSIS — R7989 Other specified abnormal findings of blood chemistry: Secondary | ICD-10-CM

## 2018-08-27 DIAGNOSIS — F419 Anxiety disorder, unspecified: Secondary | ICD-10-CM | POA: Diagnosis present

## 2018-08-27 DIAGNOSIS — R05 Cough: Secondary | ICD-10-CM | POA: Diagnosis not present

## 2018-08-27 DIAGNOSIS — Z820 Family history of epilepsy and other diseases of the nervous system: Secondary | ICD-10-CM

## 2018-08-27 DIAGNOSIS — E279 Disorder of adrenal gland, unspecified: Secondary | ICD-10-CM | POA: Diagnosis present

## 2018-08-27 DIAGNOSIS — R64 Cachexia: Secondary | ICD-10-CM | POA: Diagnosis present

## 2018-08-27 DIAGNOSIS — I248 Other forms of acute ischemic heart disease: Secondary | ICD-10-CM | POA: Diagnosis present

## 2018-08-27 DIAGNOSIS — J440 Chronic obstructive pulmonary disease with acute lower respiratory infection: Secondary | ICD-10-CM | POA: Diagnosis present

## 2018-08-27 DIAGNOSIS — J441 Chronic obstructive pulmonary disease with (acute) exacerbation: Secondary | ICD-10-CM | POA: Diagnosis present

## 2018-08-27 DIAGNOSIS — R0602 Shortness of breath: Secondary | ICD-10-CM | POA: Diagnosis not present

## 2018-08-27 DIAGNOSIS — R0902 Hypoxemia: Secondary | ICD-10-CM

## 2018-08-27 DIAGNOSIS — R778 Other specified abnormalities of plasma proteins: Secondary | ICD-10-CM

## 2018-08-27 LAB — BASIC METABOLIC PANEL
Anion gap: 10 (ref 5–15)
BUN: 28 mg/dL — ABNORMAL HIGH (ref 6–20)
CO2: 23 mmol/L (ref 22–32)
Calcium: 8.8 mg/dL — ABNORMAL LOW (ref 8.9–10.3)
Chloride: 101 mmol/L (ref 98–111)
Creatinine, Ser: 0.7 mg/dL (ref 0.44–1.00)
GFR calc Af Amer: >60 mL/min (ref >60)
Glucose, Bld: 123 mg/dL — ABNORMAL HIGH (ref 70–99)
Potassium: 3.9 mmol/L (ref 3.5–5.1)
Sodium: 134 mmol/L — ABNORMAL LOW (ref 135–145)

## 2018-08-27 LAB — TROPONIN I
Troponin I: 0.09 ng/mL (ref <0.03)
Troponin I: <0.03 ng/mL (ref <0.03)
Troponin I: <0.03 ng/mL (ref <0.03)

## 2018-08-27 LAB — CBC WITH DIFFERENTIAL/PLATELET
Abs Immature Granulocytes: 0.07 10*3/uL (ref 0.00–0.07)
Basophils Absolute: 0 10*3/uL (ref 0.0–0.1)
Basophils Relative: 0 %
EOS ABS: 0.2 10*3/uL (ref 0.0–0.5)
Eosinophils Relative: 2 %
HCT: 44.6 % (ref 36.0–46.0)
Hemoglobin: 14.9 g/dL (ref 12.0–15.0)
Immature Granulocytes: 1 %
Lymphocytes Relative: 6 %
Lymphs Abs: 0.5 10*3/uL — ABNORMAL LOW (ref 0.7–4.0)
MCH: 29.3 pg (ref 26.0–34.0)
MCHC: 33.4 g/dL (ref 30.0–36.0)
MCV: 87.6 fL (ref 80.0–100.0)
Monocytes Absolute: 0.3 10*3/uL (ref 0.1–1.0)
Monocytes Relative: 4 %
Neutro Abs: 7.1 10*3/uL (ref 1.7–7.7)
Neutrophils Relative %: 87 %
PLATELETS: 142 10*3/uL — AB (ref 150–400)
RBC: 5.09 MIL/uL (ref 3.87–5.11)
RDW: 13.4 % (ref 11.5–15.5)
Smear Review: ADEQUATE
WBC: 8.1 10*3/uL (ref 4.0–10.5)
nRBC: 0 % (ref 0.0–0.2)

## 2018-08-27 LAB — RAPID INFLUENZA A&B ANTIGENS
Influenza A (ARMC): NEGATIVE
Influenza B (ARMC): NEGATIVE

## 2018-08-27 LAB — LACTIC ACID, PLASMA
Lactic Acid, Venous: 1 mmol/L (ref 0.5–1.9)
Lactic Acid, Venous: 1 mmol/L (ref 0.5–1.9)

## 2018-08-27 MED ORDER — SODIUM CHLORIDE 0.9 % IV SOLN
1.0000 g | Freq: Once | INTRAVENOUS | Status: AC
  Administered 2018-08-27: 1 g via INTRAVENOUS
  Filled 2018-08-27: qty 10

## 2018-08-27 MED ORDER — METHYLPREDNISOLONE SODIUM SUCC 125 MG IJ SOLR
125.0000 mg | Freq: Once | INTRAMUSCULAR | Status: AC
  Administered 2018-08-27: 125 mg via INTRAVENOUS
  Filled 2018-08-27: qty 2

## 2018-08-27 MED ORDER — NICOTINE 21 MG/24HR TD PT24
21.0000 mg | MEDICATED_PATCH | Freq: Every day | TRANSDERMAL | Status: DC
  Administered 2018-08-27 – 2018-08-30 (×4): 21 mg via TRANSDERMAL
  Filled 2018-08-27 (×4): qty 1

## 2018-08-27 MED ORDER — ONDANSETRON HCL 4 MG/2ML IJ SOLN: 4.0000 mg | Freq: Four times a day (QID) | INTRAMUSCULAR | Status: DC | PRN

## 2018-08-27 MED ORDER — SODIUM CHLORIDE 0.9 % IV SOLN
1.0000 g | INTRAVENOUS | Status: DC
  Administered 2018-08-28: 1 g via INTRAVENOUS
  Filled 2018-08-27: qty 10
  Filled 2018-08-27: qty 1

## 2018-08-27 MED ORDER — ALBUTEROL SULFATE (2.5 MG/3ML) 0.083% IN NEBU
5.0000 mg | INHALATION_SOLUTION | Freq: Once | RESPIRATORY_TRACT | Status: AC
  Administered 2018-08-27: 5 mg via RESPIRATORY_TRACT
  Filled 2018-08-27: qty 6

## 2018-08-27 MED ORDER — ENOXAPARIN SODIUM 40 MG/0.4ML ~~LOC~~ SOLN
40.0000 mg | SUBCUTANEOUS | Status: DC
  Administered 2018-08-27: 17:00:00 40 mg via SUBCUTANEOUS
  Filled 2018-08-27 (×2): qty 0.4

## 2018-08-27 MED ORDER — SODIUM CHLORIDE 0.9 % IV BOLUS
500.0000 mL | Freq: Once | INTRAVENOUS | Status: AC
  Administered 2018-08-27: 500 mL via INTRAVENOUS

## 2018-08-27 MED ORDER — SERTRALINE HCL 50 MG PO TABS: 200.0000 mg | ORAL_TABLET | Freq: Every day | ORAL | Status: DC

## 2018-08-27 MED ORDER — IOHEXOL 350 MG/ML SOLN
75.0000 mL | Freq: Once | INTRAVENOUS | Status: AC | PRN
  Administered 2018-08-27: 75 mL via INTRAVENOUS

## 2018-08-27 MED ORDER — SODIUM CHLORIDE 0.9 % IV SOLN
500.0000 mg | Freq: Once | INTRAVENOUS | Status: AC
  Administered 2018-08-27: 500 mg via INTRAVENOUS
  Filled 2018-08-27: qty 500

## 2018-08-27 MED ORDER — ADULT MULTIVITAMIN W/MINERALS CH
1.0000 | ORAL_TABLET | Freq: Every day | ORAL | Status: DC
  Administered 2018-08-28 – 2018-08-29 (×2): 1 via ORAL
  Filled 2018-08-27 (×2): qty 1

## 2018-08-27 MED ORDER — ACETAMINOPHEN 325 MG PO TABS: 650.0000 mg | ORAL_TABLET | Freq: Four times a day (QID) | ORAL | Status: DC | PRN

## 2018-08-27 MED ORDER — CLONAZEPAM 0.5 MG PO TABS
0.5000 mg | ORAL_TABLET | Freq: Two times a day (BID) | ORAL | Status: DC | PRN
  Administered 2018-08-27 – 2018-08-28 (×2): 0.5 mg via ORAL
  Filled 2018-08-27 (×3): qty 1

## 2018-08-27 MED ORDER — ONDANSETRON HCL 4 MG PO TABS: 4.0000 mg | ORAL_TABLET | Freq: Four times a day (QID) | ORAL | Status: DC | PRN

## 2018-08-27 MED ORDER — SENNOSIDES-DOCUSATE SODIUM 8.6-50 MG PO TABS: 1.0000 | ORAL_TABLET | Freq: Every evening | ORAL | Status: DC | PRN

## 2018-08-27 MED ORDER — SODIUM CHLORIDE 0.9 % IV SOLN
INTRAVENOUS | Status: DC
  Administered 2018-08-27 – 2018-08-28 (×2): via INTRAVENOUS

## 2018-08-27 MED ORDER — SERTRALINE HCL 50 MG PO TABS
200.0000 mg | ORAL_TABLET | Freq: Every day | ORAL | Status: DC
  Administered 2018-08-27 – 2018-08-29 (×3): 200 mg via ORAL
  Filled 2018-08-27 (×3): qty 4

## 2018-08-27 MED ORDER — ACETAMINOPHEN 650 MG RE SUPP: 650.0000 mg | Freq: Four times a day (QID) | RECTAL | Status: DC | PRN

## 2018-08-27 MED ORDER — AZITHROMYCIN 500 MG PO TABS
500.0000 mg | ORAL_TABLET | Freq: Every day | ORAL | Status: AC
  Administered 2018-08-28 – 2018-08-29 (×2): 500 mg via ORAL
  Filled 2018-08-27 (×2): qty 1

## 2018-08-27 MED ORDER — HYDROCODONE-ACETAMINOPHEN 5-325 MG PO TABS: 1.0000 | ORAL_TABLET | ORAL | Status: DC | PRN

## 2018-08-27 MED ORDER — ONE-DAILY MULTI VITAMINS PO TABS: 1.0000 | ORAL_TABLET | Freq: Every day | ORAL | Status: DC

## 2018-08-27 NOTE — ED Notes (Signed)
Pt transported to CT by [facility]

## 2018-08-27 NOTE — ED Triage Notes (Signed)
Patient c/o cough, chest congestion, headaches, and bodyaches that started Thursday night.

## 2018-08-27 NOTE — H&P (Addendum)
[facility] NAME: Melissa Peters    MR#:  [id number]  DATE OF BIRTH:  1956-05-10  DATE OF ADMISSION:  08/27/2018  PRIMARY CARE PHYSICIAN: Marinda Elk, MD   REQUESTING/REFERRING PHYSICIAN: dr Burlene Arnt  CHIEF COMPLAINT:   SOB fatigue HISTORY OF PRESENT ILLNESS:  Melissa Peters  is a 63 y.o. female with a known history of anxiety/depression and tobacco dependence who presents today due to shortness of breath, cough and generalized fatigue.  Patient reports over the past 3 days she has not been feeling like herself.  She endorses fever, chills, shortness of breath and generalized fatigue.  CT scan shows multilobar pneumonia. Influenza testing has been negative.    PAST MEDICAL HISTORY:   Past Medical History:  Diagnosis Date  . Anxiety   . Depression     PAST SURGICAL HISTORY:   Past Surgical History:  Procedure Laterality Date  . BREAST BIOPSY Right    neg  . BREAST CYST ASPIRATION Left    fna-neg  . COLONOSCOPY WITH PROPOFOL N/A 03/27/2016   Procedure: COLONOSCOPY WITH PROPOFOL;  Surgeon: Lollie Sails, MD;  Location: [facility] ENDOSCOPY;  Service: Endoscopy;  Laterality: N/A;  . TUBAL LIGATION      SOCIAL HISTORY:   Social History   Tobacco Use  . Smoking status: Current Some Day Smoker    Packs/day: 0.50    Years: 40.00    Pack years: 20.00    Types: Cigarettes  . Smokeless tobacco: Never Used  Substance Use Topics  . Alcohol use: No    Comment: rare    FAMILY HISTORY:   Family History  Problem Relation Age of Onset  . Dementia Mother   . Alcohol abuse Mother   . Alcohol abuse Sister   . CAD Sister   . Alcohol abuse Brother   . CAD Brother   . Syncope episode Brother   . Diabetes Mellitus I Daughter   . Alzheimer's disease Maternal Grandmother   . CAD Paternal Grandfather   . Breast cancer Neg Hx     DRUG ALLERGIES:  No Known Allergies  REVIEW OF SYSTEMS:   Review of Systems   Constitutional: Positive for malaise/fatigue. Negative for chills and fever.  HENT: Negative.  Negative for ear discharge, ear pain, hearing loss, nosebleeds and sore throat.   Eyes: Negative.  Negative for blurred vision and pain.  Respiratory: Positive for cough and shortness of breath. Negative for hemoptysis and wheezing.   Cardiovascular: Negative.  Negative for chest pain, palpitations and leg swelling.  Gastrointestinal: Negative.  Negative for abdominal pain, blood in stool, diarrhea, nausea and vomiting.  Genitourinary: Negative.  Negative for dysuria.  Musculoskeletal: Negative.  Negative for back pain.  Skin: Negative.   Neurological: Negative for dizziness, tremors, speech change, focal weakness, seizures and headaches.  Endo/Heme/Allergies: Negative.  Does not bruise/bleed easily.  Psychiatric/Behavioral: Negative.  Negative for depression, hallucinations and suicidal ideas.    MEDICATIONS AT HOME:   Prior to Admission medications   Medication Sig Start Date End Date Taking? Authorizing Provider  albuterol (PROVENTIL HFA;VENTOLIN HFA) 108 (90 Base) MCG/ACT inhaler Inhale 2 puffs into the lungs every 4 (four) hours as needed for wheezing. 07/05/17  Yes Marylene Land, NP  clonazePAM (KLONOPIN) 0.5 MG tablet Take 0.5 mg by mouth 2 (two) times daily as needed for anxiety.   Yes [provider]  ERGOCALCIFEROL PO Take 1 tablet by mouth daily.   Yes [provider]  Multiple Vitamin (MULTIVITAMIN) tablet Take 1 tablet by mouth daily.   Yes [provider]  naproxen (NAPROSYN) 500 MG tablet Take 500 mg by mouth 2 (two) times daily with a meal.   Yes [provider]  oxymetazoline (AFRIN) 0.05 % nasal spray Place 1 spray into both nostrils 2 (two) times daily.   Yes [provider]  sertraline (ZOLOFT) 100 MG tablet Take 200 mg by mouth daily.    Yes [provider]      VITAL SIGNS:  Blood pressure 119/77, pulse 73,  temperature 99.6 F (37.6 C), temperature source Oral, resp. rate (!) 25, SpO2 94 %.  PHYSICAL EXAMINATION:   Physical Exam Constitutional:      General: She is not in acute distress.    Comments: Frail-appearing  HENT:     Head: Normocephalic.  Eyes:     General: No scleral icterus. Neck:     Musculoskeletal: Normal range of motion and neck supple.     Vascular: No JVD.     Trachea: No tracheal deviation.  Cardiovascular:     Rate and Rhythm: Normal rate and regular rhythm.     Heart sounds: Normal heart sounds. No murmur. No friction rub. No gallop.   Pulmonary:     Effort: Pulmonary effort is normal. No respiratory distress.     Breath sounds: Normal breath sounds. No wheezing or rales.  Chest:     Chest wall: No tenderness.  Abdominal:     General: Bowel sounds are normal. There is no distension.     Palpations: Abdomen is soft. There is no mass.     Tenderness: There is no abdominal tenderness. There is no guarding or rebound.  Musculoskeletal: Normal range of motion.  Skin:    General: Skin is warm.     Findings: No erythema or rash.  Neurological:     Mental Status: She is alert and oriented to person, place, and time.  Psychiatric:        Mood and Affect: Mood normal. Mood is not anxious.        Behavior: Behavior normal. Behavior is not agitated.        Judgment: Judgment normal.       LABORATORY PANEL:   CBC Recent Labs  Lab 08/27/18 1130  WBC 8.1  HGB 14.9  HCT 44.6  PLT 142*   ------------------------------------------------------------------------------------------------------------------  Chemistries  Recent Labs  Lab 08/27/18 1130  NA 134*  K 3.9  CL 101  CO2 23  GLUCOSE 123*  BUN 28*  CREATININE 0.70  CALCIUM 8.8*   ------------------------------------------------------------------------------------------------------------------  Cardiac Enzymes Recent Labs  Lab 08/27/18 1130  TROPONINI 0.09*    ------------------------------------------------------------------------------------------------------------------  RADIOLOGY:  Dg Chest 2 View  Result Date: 08/27/2018 CLINICAL DATA:  Short of breath and cough EXAM: CHEST - 2 VIEW COMPARISON:  09/17/2008 FINDINGS: There is consolidation in the lingula. There is patchy airspace disease in the posterior left lower lobe. Lungs are hyperaerated with interstitial prominence. Normal heart size. No pneumothorax or pleural effusion. IMPRESSION: Left upper and lower lobe airspace disease is worrisome for pneumonia. Followup PA and lateral chest X-ray is recommended in 3-4 weeks following trial of antibiotic therapy to ensure resolution and exclude underlying malignancy. Electronically Signed   By: Marybelle Killings M.D.   On: 08/27/2018 10:19   Ct Angio Chest Pe W And/or Wo Contrast  Result Date: 08/27/2018 CLINICAL DATA:  Increasing shortness of breath EXAM: CT ANGIOGRAPHY CHEST WITH CONTRAST TECHNIQUE: Multidetector CT imaging  of the chest was performed using the standard protocol during bolus administration of intravenous contrast. Multiplanar CT image reconstructions and MIPs were obtained to evaluate the vascular anatomy. CONTRAST:  71mL OMNIPAQUE 350 COMPARISON:  Plain film from earlier in the same day. FINDINGS: Cardiovascular: Thoracic aorta demonstrates no evidence of aneurysmal dilatation or dissection. No cardiac enlargement is seen. Mild coronary calcifications are noted. Pulmonary artery shows a normal branching pattern. No filling defect is identified to suggest pulmonary embolism. Mediastinum/Nodes: The esophagus is within normal limits. No sizable hilar or mediastinal adenopathy is noted. The thoracic inlet is within normal limits. Lungs/Pleura: Mild emphysematous changes are noted. Patchy infiltrates are identified bilaterally worst in the lingula and medial aspect of the right lower lobe but diffusely throughout both lungs. No focal mass lesion or  sizable effusion is noted. Upper Abdomen: Visualized upper abdomen shows a 3.9 cm hypodense lesion within the right adrenal gland. A 3.7 x 1.4 cm lesion is noted within the left adrenal gland. Hypodensity is noted within the liver consistent with underlying cyst. Musculoskeletal: Degenerative changes of the thoracic spine are noted. Review of the MIP images confirms the above findings. IMPRESSION: No evidence of pulmonary emboli. Multifocal pneumonia worst in the lingula on the left. Follow-up imaging following appropriate therapy is recommended to assess for resolution. Bilateral adrenal lesions are noted. Statistically these likely represent adenomas although nonemergent MRI workup would be helpful when clinically able. Electronically Signed   By: Inez Catalina M.D.   On: 08/27/2018 12:49    EKG:  Sinus rhythm heart rate 76 no ST elevation or depression  IMPRESSION AND PLAN:   63 year old female with a history of anxiety/depression and tobacco dependence who presents with shortness of breath and generalized fatigue.  1.  Acute hypoxic respiratory failure in the setting of community-acquired multilobar pneumonia: Wean oxygen to room air as tolerated  2.  Community-acquired multilobar pneumonia:CY shows no PE.  Continue Rocephin and azithromycin as started in the emergency room Follow-up on final blood cultures and sputum culture Patient would benefit from an outpatient follow-up chest x-ray in 4 weeks.  3.  Anxiety/depression: Continue clonazepam as needed and Zoloft  4. Tobacco dependence: Patient is encouraged to quit smoking. Counseling was provided for 4 minutes.  5. Bilateral adrenal lesions are noted. Statistically these likely represent adenomas although nonemergent MRI workup would be helpful when clinically able.  6. Elevated troponin from demand ischemia, likely: FOLLOW tele and troponins. ECHO and Consult Cardiology if troponins are increasing  All the records are reviewed and  case discussed with ED provider. Management plans discussed with the patient and she is in agreement  CODE STATUS: full  TOTAL TIME TAKING CARE OF THIS PATIENT: 41 minutes.    Mennie Spiller M.D on 08/27/2018 at 1:21 PM  Between 7am to 6pm - Pager - [phone]  After 6pm go to [email or web address] - password [email or web address] [facility]  Office  [phone]  CC: Primary care physician; Marinda Elk, MD

## 2018-08-27 NOTE — ED Provider Notes (Addendum)
[facility] Emergency Department Provider Note  ____________________________________________   I have reviewed the triage vital signs and the nursing notes. Where available I have reviewed prior notes and, if possible and indicated, outside hospital notes.    HISTORY  Chief Complaint Shortness of Breath    HPI Melissa Peters is a 63 y.o. female with a history of anxiety and depression, however has a very long smoking history, states in the last 2 days she is had cough wheeze and fever, she feels "wiped out, she went to urgent care and a negative flu test, had a chest x-ray was suggestive other oncologic pathology or pneumonia, was sent here for further evaluation.  Patient states she has a cough which is nonstop, she denies any history of COPD but she does she states have inhalers at home.  She has had no nausea vomiting or diarrhea.  No rash.  No other alleviating or aggravating factors no other prior treatment.  Is occasionally productive.  Positive URI symptoms to some minor degree as well.  T-max was 100.3 it sounds like     Past Medical History:  Diagnosis Date  . Anxiety   . Depression     There are no active problems to display for this patient.   Past Surgical History:  Procedure Laterality Date  . BREAST BIOPSY Right    neg  . BREAST CYST ASPIRATION Left    fna-neg  . COLONOSCOPY WITH PROPOFOL N/A 03/27/2016   Procedure: COLONOSCOPY WITH PROPOFOL;  Surgeon: Lollie Sails, MD;  Location: [facility] ENDOSCOPY;  Service: Endoscopy;  Laterality: N/A;  . TUBAL LIGATION      Prior to Admission medications   Medication Sig Start Date End Date Taking? Authorizing Provider  albuterol (PROVENTIL HFA;VENTOLIN HFA) 108 (90 Base) MCG/ACT inhaler Inhale 2 puffs into the lungs every 4 (four) hours as needed for wheezing. 07/05/17   Marylene Land, NP  clonazePAM (KLONOPIN) 0.5 MG tablet Take 0.5 mg by mouth 2 (two) times daily as needed for anxiety.     [provider]  ERGOCALCIFEROL PO Take 1 tablet by mouth daily.    [provider]  Multiple Vitamin (MULTIVITAMIN) tablet Take 1 tablet by mouth daily.    [provider]  naproxen (NAPROSYN) 500 MG tablet Take 500 mg by mouth 2 (two) times daily with a meal.    [provider]  nystatin (MYCOSTATIN) 100000 UNIT/ML suspension Use as directed 5 mLs (500,000 Units total) in the mouth or throat 4 (four) times daily. Swish and swallow. 07/10/18   Lorin Picket, PA-C  oxymetazoline (AFRIN) 0.05 % nasal spray Place 1 spray into both nostrils 2 (two) times daily.    [provider]  sertraline (ZOLOFT) 100 MG tablet Take 150 mg by mouth daily.    [provider]    Allergies Patient has no known allergies.  Family History  Problem Relation Age of Onset  . Dementia Mother   . Alcohol abuse Mother   . Alcohol abuse Sister   . CAD Sister   . Alcohol abuse Brother   . CAD Brother   . Syncope episode Brother   . Diabetes Mellitus I Daughter   . Alzheimer's disease Maternal Grandmother   . CAD Paternal Grandfather   . Breast cancer Neg Hx     Social History Social History   Tobacco Use  . Smoking status: Current Some Day Smoker    Packs/day: 0.50    Years: 40.00  Pack years: 20.00    Types: Cigarettes  . Smokeless tobacco: Never Used  Substance Use Topics  . Alcohol use: No    Comment: rare  . Drug use: No    Review of Systems Constitutional: + fever/chills Eyes: No visual changes. ENT: No sore throat. No stiff neck no neck pain Cardiovascular: Denies chest pain. Respiratory: + Cough shortness of breath. Gastrointestinal:   no vomiting.  No diarrhea.  No constipation. Genitourinary: Negative for dysuria. Musculoskeletal: Negative lower extremity swelling Skin: Negative for rash. Neurological: Negative for severe headaches, focal weakness or numbness.   ____________________________________________   PHYSICAL  EXAM:  VITAL SIGNS: ED Triage Vitals  Enc Vitals Group     BP 08/27/18 1126 126/78     Pulse Rate 08/27/18 1126 77     Resp 08/27/18 1126 20     Temp 08/27/18 1128 99.6 F (37.6 C)     Temp Source 08/27/18 1128 Oral     SpO2 08/27/18 1122 93 %     Weight --      Height --      Head Circumference --      Peak Flow --      Pain Score 08/27/18 1127 10     Pain Loc --      Pain Edu? --      Excl. in [place]? --     Constitutional: Alert and oriented.  Looks as if she does not feel well does not seem to be in danger of imminent need of intubation however Eyes: Conjunctivae are normal Head: Atraumatic HEENT: No congestion/rhinnorhea. Mucous membranes are moist.  Oropharynx non-erythematous Neck:   Nontender with no meningismus, no masses, no stridor Cardiovascular: Normal rate, regular rhythm. Grossly normal heart sounds.  Good peripheral circulation. Respiratory: Persistent cough the entire exam.  Patient with occasional rales and rhonchi more on the left than the right Abdominal: Soft and nontender. No distention. No guarding no rebound Back:  There is no focal tenderness or step off.  there is no midline tenderness there are no lesions noted. there is no CVA tenderness Musculoskeletal: No lower extremity tenderness, no upper extremity tenderness. No joint effusions, no DVT signs strong distal pulses no edema Neurologic:  Normal speech and language. No gross focal neurologic deficits are appreciated.  Skin:  Skin is warm, dry and intact. No rash noted. Psychiatric: Mood and affect are normal. Speech and behavior are normal.  ____________________________________________   LABS (all labs ordered are listed, but only abnormal results are displayed)  Labs Reviewed  CULTURE, BLOOD (ROUTINE X 2)  CULTURE, BLOOD (ROUTINE X 2)  CBC WITH DIFFERENTIAL/PLATELET  BASIC METABOLIC PANEL  LACTIC ACID, PLASMA  LACTIC ACID, PLASMA    Pertinent labs  results that were available during my care  of the patient were reviewed by me and considered in my medical decision making (see chart for details). ____________________________________________  EKG  I personally interpreted any EKGs ordered by me or triage Sinus rhythm rate 76 bpm normal axis no acute ST elevation depression nonspecific ST changes ____________________________________________  RADIOLOGY  Pertinent labs & imaging results that were available during my care of the patient were reviewed by me and considered in my medical decision making (see chart for details). If possible, patient and/or family made aware of any abnormal findings.  Dg Chest 2 View  Result Date: 08/27/2018 CLINICAL DATA:  Short of breath and cough EXAM: CHEST - 2 VIEW COMPARISON:  09/17/2008 FINDINGS: There is consolidation in the lingula.  There is patchy airspace disease in the posterior left lower lobe. Lungs are hyperaerated with interstitial prominence. Normal heart size. No pneumothorax or pleural effusion. IMPRESSION: Left upper and lower lobe airspace disease is worrisome for pneumonia. Followup PA and lateral chest X-ray is recommended in 3-4 weeks following trial of antibiotic therapy to ensure resolution and exclude underlying malignancy. Electronically Signed   By: Marybelle Killings M.D.   On: 08/27/2018 10:19   ____________________________________________    PROCEDURES  Procedure(s) performed: None  Procedures  Critical Care performed: CRITICAL CARE Performed by: Schuyler Amor   Total critical care time: 45 minutes  Critical care time was exclusive of separately billable procedures and treating other patients.  Critical care was necessary to treat or prevent imminent or life-threatening deterioration.  Critical care was time spent personally by me on the following activities: development of treatment plan with patient and/or surrogate as well as nursing, discussions with consultants, evaluation of patient's response to treatment,  examination of patient, obtaining history from patient or surrogate, ordering and performing treatments and interventions, ordering and review of laboratory studies, ordering and review of radiographic studies, pulse oximetry and re-evaluation of patient's condition.   ____________________________________________   INITIAL IMPRESSION / ASSESSMENT AND PLAN / ED COURSE  Pertinent labs & imaging results that were available during my care of the patient were reviewed by me and considered in my medical decision making (see chart for details).  Patient here with cough and wheeze and pneumonia on chest x-ray, 63 year old smoker appears to have COPD even though it does not seem to be diagnosed.  Sats are 94.  There is some question on the x-ray as to whether this is pneumonia only, I will obtain CT scan to further evaluate the images that we see on chest, we will give her Rocephin this her max prednisone and albuterol, and we will continue to observe her here in the department.  She does not appear to be septic fortunately.  ----------------------------------------- 1:15 PM on 08/27/2018 -----------------------------------------  To be admitted for multilobar pneumonia, of note, I did talk to the radiologist who read the CT scan, appreciate the consult, he feels that this does not bear any of the hallmark findings of coronavirus, 19, I also talked to our infectious disease personnel at this hospital, who informed me that given her lack of history of travel or known exposure to anyone else with that particular pathology there is no indication for testing.  Therefore, we are treating her as she most likely is is a regular community-acquired pneumonia of uncertain etiology thus far and she will be admitted    ____________________________________________   FINAL CLINICAL IMPRESSION(S) / ED DIAGNOSES  Final diagnoses:  None      This chart was dictated using voice recognition software.  Despite  best efforts to proofread,  errors can occur which can change meaning.      Schuyler Amor, MD 08/27/18 1159    Schuyler Amor, MD 08/27/18 1316

## 2018-08-27 NOTE — ED Triage Notes (Signed)
Pt to ED via [facility] from [facility]. Pt presented to the with cough, congestion, fever, and body aches x 2 days. Pt was worked up and tested negative for flu. Pt Chest x-ray showed Left Upper and Lower lobe pneumonia. Pt had room air sat of 89% with EMS. Pt currently had SpO2 of 94% on 2 liter of O2 via [place]. Pt is in NAD at this time.

## 2018-08-27 NOTE — ED Notes (Signed)
ED TO INPATIENT HANDOFF REPORT  ED Nurse Name and Phone #:  Levada Dy, RN [phone]  S Name/Age/Gender Melissa Peters 63 y.o. female Room/Bed: ED17A/ED17A  Code Status   Code Status: Full Code  Home/SNF/Other Home Patient oriented to: self, place, time and situation Is this baseline? Yes   Triage Complete: Triage complete  Chief Complaint SOB  Triage Note Pt to ED via [facility] from [facility]. Pt presented to the with cough, congestion, fever, and body aches x 2 days. Pt was worked up and tested negative for flu. Pt Chest x-ray showed Left Upper and Lower lobe pneumonia. Pt had room air sat of 89% with EMS. Pt currently had SpO2 of 94% on 2 liter of O2 via [place]. Pt is in NAD at this time.    Allergies No Known Allergies  Level of Care/Admitting Diagnosis ED Disposition    ED Disposition Condition [facility] Area: [place] [[id number]]  Level of Care: Med-Surg [16]  Diagnosis: PNA (pneumonia) [811914]  Admitting Physician: MODY, Ulice Bold [[id number]]  Attending Physician: MODY, Ulice Bold [[id number]]  Estimated length of stay: 3 - 4 days  Certification:: I certify this patient will need inpatient services for at least 2 midnights  PT Class (Do Not Modify): Inpatient [101]  PT Acc Code (Do Not Modify): Private [1]       B Medical/Surgery History Past Medical History:  Diagnosis Date  . Anxiety   . Depression    Past Surgical History:  Procedure Laterality Date  . BREAST BIOPSY Right    neg  . BREAST CYST ASPIRATION Left    fna-neg  . COLONOSCOPY WITH PROPOFOL N/A 03/27/2016   Procedure: COLONOSCOPY WITH PROPOFOL;  Surgeon: Lollie Sails, MD;  Location: [facility] ENDOSCOPY;  Service: Endoscopy;  Laterality: N/A;  . TUBAL LIGATION       A IV Location/Drains/Wounds Patient Lines/Drains/Airways Status   Active Line/Drains/Airways    Name:   Placement date:   Placement time:   Site:   Days:   Peripheral IV 08/27/18 Left Antecubital   08/27/18     1054    Antecubital   less than 1   Peripheral IV 08/27/18 Right Forearm   08/27/18    1212    Forearm   less than 1   Airway   03/27/16    1544     883          Intake/Output Last 24 hours No intake or output data in the 24 hours ending 08/27/18 1416  Labs/Imaging Results for orders placed or performed during the hospital encounter of 08/27/18 (from the past 48 hour(s))  CBC with Differential     Status: Abnormal   Collection Time: 08/27/18 11:30 AM  Result Value Ref Range   WBC 8.1 4.0 - 10.5 K/uL   RBC 5.09 3.87 - 5.11 MIL/uL   Hemoglobin 14.9 12.0 - 15.0 g/dL   HCT 44.6 36.0 - 46.0 %   MCV 87.6 80.0 - 100.0 fL   MCH 29.3 26.0 - 34.0 pg   MCHC 33.4 30.0 - 36.0 g/dL   RDW 13.4 11.5 - 15.5 %   Platelets 142 (L) 150 - 400 K/uL   nRBC 0.0 0.0 - 0.2 %   Neutrophils Relative % 87 %   Neutro Abs 7.1 1.7 - 7.7 K/uL   Lymphocytes Relative 6 %   Lymphs Abs 0.5 (L) 0.7 - 4.0 K/uL   Monocytes Relative 4 %   Monocytes Absolute  0.3 0.1 - 1.0 K/uL   Eosinophils Relative 2 %   Eosinophils Absolute 0.2 0.0 - 0.5 K/uL   Basophils Relative 0 %   Basophils Absolute 0.0 0.0 - 0.1 K/uL   WBC Morphology MILD LEFT SHIFT (1-5% METAS, OCC MYELO, OCC BANDS)    RBC Morphology MORPHOLOGY UNREMARKABLE    Smear Review PLATELETS APPEAR ADEQUATE    Immature Granulocytes 1 %   Abs Immature Granulocytes 0.07 0.00 - 0.07 K/uL    Comment: Performed at [facility], [address] [place], [place] [address]  Basic metabolic panel     Status: Abnormal   Collection Time: 08/27/18 11:30 AM  Result Value Ref Range   Sodium 134 (L) 135 - 145 mmol/L   Potassium 3.9 3.5 - 5.1 mmol/L   Chloride 101 98 - 111 mmol/L   CO2 23 22 - 32 mmol/L   Glucose, Bld 123 (H) 70 - 99 mg/dL   BUN 28 (H) 6 - 20 mg/dL   Creatinine, Ser 0.70 0.44 - 1.00 mg/dL   Calcium 8.8 (L) 8.9 - 10.3 mg/dL   GFR calc non Af Amer >60 >60 mL/min   GFR calc Af Amer >60 >60 mL/min   Anion gap 10 5 - 15    Comment: Performed at  [facility], [address] [place], [place] [address]  Troponin I - Add-On to previous collection     Status: Abnormal   Collection Time: 08/27/18 11:30 AM  Result Value Ref Range   Troponin I 0.09 (HH) <0.03 ng/mL    Comment: CRITICAL RESULT CALLED TO, READ BACK BY AND VERIFIED WITH Pricilla Moehle AT 4818 ON 08/27/18 JJB Performed at [facility], [address] [place], [place] [address]   Lactic acid, plasma     Status: None   Collection Time: 08/27/18 12:03 PM  Result Value Ref Range   Lactic Acid, Venous 1.0 0.5 - 1.9 mmol/L    Comment: Performed at [facility], [address] [place], [place] [address]   Dg Chest 2 View  Result Date: 08/27/2018 CLINICAL DATA:  Short of breath and cough EXAM: CHEST - 2 VIEW COMPARISON:  09/17/2008 FINDINGS: There is consolidation in the lingula. There is patchy airspace disease in the posterior left lower lobe. Lungs are hyperaerated with interstitial prominence. Normal heart size. No pneumothorax or pleural effusion. IMPRESSION: Left upper and lower lobe airspace disease is worrisome for pneumonia. Followup PA and lateral chest X-ray is recommended in 3-4 weeks following trial of antibiotic therapy to ensure resolution and exclude underlying malignancy. Electronically Signed   By: Marybelle Killings M.D.   On: 08/27/2018 10:19   Ct Angio Chest Pe W And/or Wo Contrast  Result Date: 08/27/2018 CLINICAL DATA:  Increasing shortness of breath EXAM: CT ANGIOGRAPHY CHEST WITH CONTRAST TECHNIQUE: Multidetector CT imaging of the chest was performed using the standard protocol during bolus administration of intravenous contrast. Multiplanar CT image reconstructions and MIPs were obtained to evaluate the vascular anatomy. CONTRAST:  33mL OMNIPAQUE 350 COMPARISON:  Plain film from earlier in the same day. FINDINGS: Cardiovascular: Thoracic aorta demonstrates no evidence of aneurysmal dilatation or dissection. No cardiac enlargement is  seen. Mild coronary calcifications are noted. Pulmonary artery shows a normal branching pattern. No filling defect is identified to suggest pulmonary embolism. Mediastinum/Nodes: The esophagus is within normal limits. No sizable hilar or mediastinal adenopathy is noted. The thoracic inlet is within normal limits. Lungs/Pleura: Mild emphysematous changes are noted. Patchy infiltrates are identified  bilaterally worst in the lingula and medial aspect of the right lower lobe but diffusely throughout both lungs. No focal mass lesion or sizable effusion is noted. Upper Abdomen: Visualized upper abdomen shows a 3.9 cm hypodense lesion within the right adrenal gland. A 3.7 x 1.4 cm lesion is noted within the left adrenal gland. Hypodensity is noted within the liver consistent with underlying cyst. Musculoskeletal: Degenerative changes of the thoracic spine are noted. Review of the MIP images confirms the above findings. IMPRESSION: No evidence of pulmonary emboli. Multifocal pneumonia worst in the lingula on the left. Follow-up imaging following appropriate therapy is recommended to assess for resolution. Bilateral adrenal lesions are noted. Statistically these likely represent adenomas although nonemergent MRI workup would be helpful when clinically able. Electronically Signed   By: Inez Catalina M.D.   On: 08/27/2018 12:49    Pending Labs Unresulted Labs (From admission, onward)    Start     Ordered   09/03/18 0500  Creatinine, serum  (enoxaparin (LOVENOX)    CrCl >/= 30 ml/min)  Weekly,   STAT    Comments:  while on enoxaparin therapy    08/27/18 1306   08/28/18 2919  Basic metabolic panel  Tomorrow morning,   STAT     08/27/18 1306   08/28/18 0500  CBC  Tomorrow morning,   STAT     08/27/18 1306   08/27/18 1304  CBC  (enoxaparin (LOVENOX)    CrCl >/= 30 ml/min)  Once,   STAT    Comments:  Baseline for enoxaparin therapy IF NOT ALREADY DRAWN.  Notify MD if PLT < 100 K.    08/27/18 1306   08/27/18 1304   Creatinine, serum  (enoxaparin (LOVENOX)    CrCl >/= 30 ml/min)  Once,   STAT    Comments:  Baseline for enoxaparin therapy IF NOT ALREADY DRAWN.    08/27/18 1306   08/27/18 1303  HIV antibody (Routine Testing)  Once,   STAT     08/27/18 1306   08/27/18 1154  Lactic acid, plasma  Now then every 2 hours,   STAT     08/27/18 1153   08/27/18 1152  Culture, blood (routine x 2)  BLOOD CULTURE X 2,   STAT     08/27/18 1151          Vitals/Pain Today's Vitals   08/27/18 1215 08/27/18 1300 08/27/18 1330 08/27/18 1400  BP: 119/77 111/71 110/69 120/80  Pulse: 73 94 83 88  Resp: (!) 25 19 18 19   Temp:      TempSrc:      SpO2: 94% 97% 94% (!) 86%  PainSc:        Isolation Precautions No active isolations  Medications Medications  enoxaparin (LOVENOX) injection 40 mg (has no administration in time range)  0.9 %  sodium chloride infusion (has no administration in time range)  acetaminophen (TYLENOL) tablet 650 mg (has no administration in time range)    Or  acetaminophen (TYLENOL) suppository 650 mg (has no administration in time range)  HYDROcodone-acetaminophen (NORCO/VICODIN) 5-325 MG per tablet 1-2 tablet (has no administration in time range)  senna-docusate (Senokot-S) tablet 1 tablet (has no administration in time range)  ondansetron (ZOFRAN) tablet 4 mg (has no administration in time range)    Or  ondansetron (ZOFRAN) injection 4 mg (has no administration in time range)  cefTRIAXone (ROCEPHIN) 1 g in sodium chloride 0.9 % 100 mL IVPB (has no administration in time range)  azithromycin (ZITHROMAX) tablet  500 mg (has no administration in time range)  nicotine (NICODERM CQ - dosed in mg/24 hours) patch 21 mg (has no administration in time range)  clonazePAM (KLONOPIN) tablet 0.5 mg (has no administration in time range)  sertraline (ZOLOFT) tablet 200 mg (has no administration in time range)  sodium chloride 0.9 % bolus 500 mL (0 mLs Intravenous Stopped 08/27/18 1414)   methylPREDNISolone sodium succinate (SOLU-MEDROL) 125 mg/2 mL injection 125 mg (125 mg Intravenous Given 08/27/18 1234)  albuterol (PROVENTIL) (2.5 MG/3ML) 0.083% nebulizer solution 5 mg (5 mg Nebulization Given 08/27/18 1237)  cefTRIAXone (ROCEPHIN) 1 g in sodium chloride 0.9 % 100 mL IVPB (0 g Intravenous Stopped 08/27/18 1414)  azithromycin (ZITHROMAX) 500 mg in sodium chloride 0.9 % 250 mL IVPB (0 mg Intravenous Stopped 08/27/18 1414)  iohexol (OMNIPAQUE) 350 MG/ML injection 75 mL (75 mLs Intravenous Contrast Given 08/27/18 1219)    Mobility walks Low fall risk   Focused Assessments Pulmonary Assessment Handoff:  Lung sounds: L Breath Sounds: Clear, Coarse crackles R Breath Sounds: Clear, Diminished O2 Device: Nasal Cannula O2 Flow Rate (L/min): 2 L/min      R Recommendations: See Admitting Provider Note  Report given to:   Additional Notes:

## 2018-08-27 NOTE — ED Provider Notes (Signed)
[facility]    CSN: [id number] Arrival date & time: 08/27/18  7846     History   Chief Complaint Chief Complaint  Patient presents with  . Headache  . Cough  . Generalized Body Aches    HPI Melissa Peters is a 63 y.o. female.   63 yo female with a cough, chest congestion, headaches and body aches for the past 3 days. States she has become progressively short of breath.   The history is provided by the patient.  Headache  Associated symptoms: cough   Cough  Associated symptoms: headaches     Past Medical History:  Diagnosis Date  . Anxiety   . Depression     There are no active problems to display for this patient.   Past Surgical History:  Procedure Laterality Date  . BREAST BIOPSY Right    neg  . BREAST CYST ASPIRATION Left    fna-neg  . COLONOSCOPY WITH PROPOFOL N/A 03/27/2016   Procedure: COLONOSCOPY WITH PROPOFOL;  Surgeon: Lollie Sails, MD;  Location: [facility] ENDOSCOPY;  Service: Endoscopy;  Laterality: N/A;  . TUBAL LIGATION      OB History   No obstetric history on file.      Home Medications    Prior to Admission medications   Medication Sig Start Date End Date Taking? Authorizing Provider  albuterol (PROVENTIL HFA;VENTOLIN HFA) 108 (90 Base) MCG/ACT inhaler Inhale 2 puffs into the lungs every 4 (four) hours as needed for wheezing. 07/05/17  Yes Marylene Land, NP  clonazePAM (KLONOPIN) 0.5 MG tablet Take 0.5 mg by mouth 2 (two) times daily as needed for anxiety.   Yes [provider]  Multiple Vitamin (MULTIVITAMIN) tablet Take 1 tablet by mouth daily.   Yes [provider]  naproxen (NAPROSYN) 500 MG tablet Take 500 mg by mouth 2 (two) times daily with a meal.   Yes [provider]  oxymetazoline (AFRIN) 0.05 % nasal spray Place 1 spray into both nostrils 2 (two) times daily.   Yes [provider]  sertraline (ZOLOFT) 100 MG tablet Take 150 mg by mouth daily.   Yes [provider]    ERGOCALCIFEROL PO Take 1 tablet by mouth daily.    [provider]  nystatin (MYCOSTATIN) 100000 UNIT/ML suspension Use as directed 5 mLs (500,000 Units total) in the mouth or throat 4 (four) times daily. Swish and swallow. 07/10/18   Lorin Picket, PA-C    Family History Family History  Problem Relation Age of Onset  . Dementia Mother   . Alcohol abuse Mother   . Alcohol abuse Sister   . CAD Sister   . Alcohol abuse Brother   . CAD Brother   . Syncope episode Brother   . Diabetes Mellitus I Daughter   . Alzheimer's disease Maternal Grandmother   . CAD Paternal Grandfather   . Breast cancer Neg Hx     Social History Social History   Tobacco Use  . Smoking status: Current Some Day Smoker    Packs/day: 0.50    Years: 40.00    Pack years: 20.00    Types: Cigarettes  . Smokeless tobacco: Never Used  Substance Use Topics  . Alcohol use: No    Comment: rare  . Drug use: No     Allergies   Patient has no known allergies.   Review of Systems Review of Systems  Respiratory: Positive for cough.   Neurological: Positive for headaches.     Physical  Exam Triage Vital Signs ED Triage Vitals  Enc Vitals Group     BP 08/27/18 0858 (!) 127/100     Pulse Rate 08/27/18 0858 83     Resp 08/27/18 0858 16     Temp 08/27/18 0858 98.5 F (36.9 C)     Temp Source 08/27/18 0858 Oral     SpO2 08/27/18 0858 90 %     Weight 08/27/18 0855 130 lb (59 kg)     Height 08/27/18 0855 5\' 6"  (1.676 m)     Head Circumference --      Peak Flow --      Pain Score 08/27/18 0855 10     Pain Loc --      Pain Edu? --      Excl. in [place]? --    No data found.  Updated Vital Signs BP (!) 127/100 (BP Location: Left Arm)   Pulse 83   Temp 98.5 F (36.9 C) (Oral)   Resp 16   Ht 5\' 6"  (1.676 m)   Wt 59 kg   SpO2 90%   BMI 20.98 kg/m   Visual Acuity Right Eye Distance:   Left Eye Distance:   Bilateral Distance:    Right Eye Near:   Left Eye Near:    Bilateral Near:      Physical Exam Vitals signs and nursing note reviewed.  Constitutional:      General: She is not in acute distress.    Appearance: She is well-developed. She is not toxic-appearing or diaphoretic.  HENT:     Head: Normocephalic and atraumatic.     Mouth/Throat:     Pharynx: Uvula midline.  Neck:     Musculoskeletal: Normal range of motion and neck supple.     Thyroid: No thyromegaly.  Cardiovascular:     Rate and Rhythm: Normal rate and regular rhythm.     Heart sounds: Normal heart sounds.  Pulmonary:     Effort: Pulmonary effort is normal. No respiratory distress.     Breath sounds: Normal breath sounds. No stridor. No wheezing, rhonchi or rales.  Lymphadenopathy:     Cervical: No cervical adenopathy.  Neurological:     Mental Status: She is alert.    O2 sat: 89-90% on RA; 96% on 2L  UC Treatments / Results  Labs (all labs ordered are listed, but only abnormal results are displayed) Labs Reviewed  RAPID INFLUENZA A&B ANTIGENS ([place])    EKG None  Radiology Dg Chest 2 View  Result Date: 08/27/2018 CLINICAL DATA:  Short of breath and cough EXAM: CHEST - 2 VIEW COMPARISON:  09/17/2008 FINDINGS: There is consolidation in the lingula. There is patchy airspace disease in the posterior left lower lobe. Lungs are hyperaerated with interstitial prominence. Normal heart size. No pneumothorax or pleural effusion. IMPRESSION: Left upper and lower lobe airspace disease is worrisome for pneumonia. Followup PA and lateral chest X-ray is recommended in 3-4 weeks following trial of antibiotic therapy to ensure resolution and exclude underlying malignancy. Electronically Signed   By: Marybelle Killings M.D.   On: 08/27/2018 10:19    Procedures Procedures (including critical care time)  Medications Ordered in UC Medications - No data to display  Initial Impression / Assessment and Plan / UC Course  I have reviewed the triage vital signs and the nursing notes.  Pertinent labs &  imaging results that were available during my care of the patient were reviewed by me and considered in my medical decision making (see  chart for details).      Final Clinical Impressions(s) / UC Diagnoses   Final diagnoses:  Pneumonia of left lower lobe due to infectious organism [facility])  Hypoxia    ED Prescriptions    None      1. x-ray results and diagnoses reviewed with patient and daughter;  recommend patient go to Emergency Department by EMS; IV saline lock; 02 per [place]; report called to charge RN at [facility] ED   Controlled Substance Prescriptions  Controlled Substance Registry consulted? Not Applicable   Norval Gable, MD 08/27/18 1051

## 2018-08-28 LAB — BASIC METABOLIC PANEL
Anion gap: 8 (ref 5–15)
BUN: 22 mg/dL — ABNORMAL HIGH (ref 6–20)
CO2: 22 mmol/L (ref 22–32)
Calcium: 8.5 mg/dL — ABNORMAL LOW (ref 8.9–10.3)
Chloride: 108 mmol/L (ref 98–111)
Creatinine, Ser: 0.67 mg/dL (ref 0.44–1.00)
GFR calc non Af Amer: >60 mL/min (ref >60)
Glucose, Bld: 128 mg/dL — ABNORMAL HIGH (ref 70–99)
Potassium: 3.7 mmol/L (ref 3.5–5.1)
Sodium: 138 mmol/L (ref 135–145)

## 2018-08-28 LAB — CBC
HCT: 40.3 % (ref 36.0–46.0)
Hemoglobin: 13.4 g/dL (ref 12.0–15.0)
MCH: 29.8 pg (ref 26.0–34.0)
MCHC: 33.3 g/dL (ref 30.0–36.0)
MCV: 89.8 fL (ref 80.0–100.0)
PLATELETS: 147 10*3/uL — AB (ref 150–400)
RBC: 4.49 MIL/uL (ref 3.87–5.11)
RDW: 13.3 % (ref 11.5–15.5)
WBC: 7.6 10*3/uL (ref 4.0–10.5)
nRBC: 0 % (ref 0.0–0.2)

## 2018-08-28 LAB — TROPONIN I
Troponin I: 0.09 ng/mL (ref <0.03)
Troponin I: <0.03 ng/mL (ref <0.03)

## 2018-08-28 MED ORDER — IPRATROPIUM-ALBUTEROL 0.5-2.5 (3) MG/3ML IN SOLN: 3.0000 mL | RESPIRATORY_TRACT | Status: DC | PRN

## 2018-08-28 MED ORDER — IPRATROPIUM-ALBUTEROL 20-100 MCG/ACT IN AERS: 1.0000 | INHALATION_SPRAY | Freq: Four times a day (QID) | RESPIRATORY_TRACT | Status: DC | PRN

## 2018-08-28 MED ORDER — ASPIRIN EC 81 MG PO TBEC
81.0000 mg | DELAYED_RELEASE_TABLET | Freq: Every day | ORAL | Status: DC
  Administered 2018-08-28 – 2018-08-30 (×3): 81 mg via ORAL
  Filled 2018-08-28 (×3): qty 1

## 2018-08-28 MED ORDER — ENSURE ENLIVE PO LIQD: 237.0000 mL | Freq: Two times a day (BID) | ORAL | Status: DC

## 2018-08-28 MED ORDER — TIOTROPIUM BROMIDE MONOHYDRATE 18 MCG IN CAPS
18.0000 ug | ORAL_CAPSULE | Freq: Every day | RESPIRATORY_TRACT | Status: DC
  Administered 2018-08-28 – 2018-08-30 (×3): 18 ug via RESPIRATORY_TRACT
  Filled 2018-08-28: qty 5

## 2018-08-28 NOTE — Progress Notes (Signed)
[place] at [place] NAME: Melissa Peters    MR#:  [id number]  DATE OF BIRTH:  01-10-1956  SUBJECTIVE:  patient feels a lot better. Denies any chest pain. Some shortness of breath on exertion. No fever.  REVIEW OF SYSTEMS:   Review of Systems  Constitutional: Positive for malaise/fatigue. Negative for chills, fever and weight loss.  HENT: Negative for ear discharge, ear pain and nosebleeds.   Eyes: Negative for blurred vision, pain and discharge.  Respiratory: Positive for cough and shortness of breath. Negative for sputum production, wheezing and stridor.   Cardiovascular: Negative for chest pain, palpitations, orthopnea and PND.  Gastrointestinal: Negative for abdominal pain, diarrhea, nausea and vomiting.  Genitourinary: Negative for frequency and urgency.  Musculoskeletal: Negative for back pain and joint pain.  Neurological: Positive for weakness. Negative for sensory change, speech change and focal weakness.  Psychiatric/Behavioral: Negative for depression and hallucinations. The patient is not nervous/anxious.    Tolerating Diet: Tolerating PT:   DRUG ALLERGIES:  No Known Allergies  VITALS:  Blood pressure 116/77, pulse 65, temperature 98.9 F (37.2 C), resp. rate 18, height 5\' 6"  (1.676 m), weight 52.3 kg, SpO2 96 %.  PHYSICAL EXAMINATION:   Physical Exam  GENERAL:  63 y.o.-year-old patient lying in the bed with no acute distress. Thin, cachectic EYES: Pupils equal, round, reactive to light and accommodation. No scleral icterus. Extraocular muscles intact.  HEENT: Head atraumatic, normocephalic. Oropharynx and nasopharynx clear.  NECK:  Supple, no jugular venous distention. No thyroid enlargement, no tenderness.  LUNGS: distant breath sounds bilaterally, no wheezing, rales, rhonchi. No use of accessory muscles of respiration.  CARDIOVASCULAR: S1, S2 normal. No murmurs, rubs, or gallops.  ABDOMEN: Soft, nontender,  nondistended. Bowel sounds present. No organomegaly or mass.  EXTREMITIES: No cyanosis, clubbing or edema b/l.    NEUROLOGIC: Cranial nerves II through XII are intact. No focal Motor or sensory deficits b/l.   PSYCHIATRIC:  patient is alert and oriented x 3.  SKIN: No obvious rash, lesion, or ulcer.   LABORATORY PANEL:  CBC Recent Labs  Lab 08/28/18 0309  WBC 7.6  HGB 13.4  HCT 40.3  PLT 147*    Chemistries  Recent Labs  Lab 08/28/18 0309  NA 138  K 3.7  CL 108  CO2 22  GLUCOSE 128*  BUN 22*  CREATININE 0.67  CALCIUM 8.5*   Cardiac Enzymes Recent Labs  Lab 08/28/18 0309  TROPONINI 0.09*   RADIOLOGY:  Dg Chest 2 View  Result Date: 08/27/2018 CLINICAL DATA:  Short of breath and cough EXAM: CHEST - 2 VIEW COMPARISON:  09/17/2008 FINDINGS: There is consolidation in the lingula. There is patchy airspace disease in the posterior left lower lobe. Lungs are hyperaerated with interstitial prominence. Normal heart size. No pneumothorax or pleural effusion. IMPRESSION: Left upper and lower lobe airspace disease is worrisome for pneumonia. Followup PA and lateral chest X-ray is recommended in 3-4 weeks following trial of antibiotic therapy to ensure resolution and exclude underlying malignancy. Electronically Signed   By: Marybelle Killings M.D.   On: 08/27/2018 10:19   Ct Angio Chest Pe W And/or Wo Contrast  Result Date: 08/27/2018 CLINICAL DATA:  Increasing shortness of breath EXAM: CT ANGIOGRAPHY CHEST WITH CONTRAST TECHNIQUE: Multidetector CT imaging of the chest was performed using the standard protocol during bolus administration of intravenous contrast. Multiplanar CT image reconstructions and MIPs were obtained to evaluate the vascular anatomy. CONTRAST:  38mL OMNIPAQUE 350 COMPARISON:  Plain film from earlier in the same day. FINDINGS: Cardiovascular: Thoracic aorta demonstrates no evidence of aneurysmal dilatation or dissection. No cardiac enlargement is seen. Mild coronary  calcifications are noted. Pulmonary artery shows a normal branching pattern. No filling defect is identified to suggest pulmonary embolism. Mediastinum/Nodes: The esophagus is within normal limits. No sizable hilar or mediastinal adenopathy is noted. The thoracic inlet is within normal limits. Lungs/Pleura: Mild emphysematous changes are noted. Patchy infiltrates are identified bilaterally worst in the lingula and medial aspect of the right lower lobe but diffusely throughout both lungs. No focal mass lesion or sizable effusion is noted. Upper Abdomen: Visualized upper abdomen shows a 3.9 cm hypodense lesion within the right adrenal gland. A 3.7 x 1.4 cm lesion is noted within the left adrenal gland. Hypodensity is noted within the liver consistent with underlying cyst. Musculoskeletal: Degenerative changes of the thoracic spine are noted. Review of the MIP images confirms the above findings. IMPRESSION: No evidence of pulmonary emboli. Multifocal pneumonia worst in the lingula on the left. Follow-up imaging following appropriate therapy is recommended to assess for resolution. Bilateral adrenal lesions are noted. Statistically these likely represent adenomas although nonemergent MRI workup would be helpful when clinically able. Electronically Signed   By: Inez Catalina M.D.   On: 08/27/2018 12:49   ASSESSMENT AND PLAN:  63 year old female with a history of anxiety/depression and tobacco dependence who presents with shortness of breath and generalized fatigue.  1.  Acute hypoxic respiratory failure in the setting of community-acquired multilobar pneumonia: Wean oxygen to room air as tolerated  2.  Community-acquired multilobar pneumonia:CT shows no PE. -Continue Rocephin and azithromycin  -Follow-up  blood cultures negative -Patient would benefit from an outpatient follow-up chest x-ray in 4 weeks. -Incentive spirometer  3.  Anxiety/depression: Continue clonazepam as needed and Zoloft  4. Tobacco  dependence: Patient is encouraged to quit smoking. Counseling was provided for 4 minutes.  5. Bilateral adrenal lesions are noted.  -Statistically these likely represent adenomas although nonemergent MRI workup would be helpful when clinically able-- will defer it to primary care physician  6. Elevated troponin from demand ischemia, likely-- no history of CAD. No other risk factors. FOLLOW tele and troponins. 0.09--0.03--0.03--0.09--trop pending  Management plans discussed with the patientand they are in agreement.  CODE STATUS: full  DVT Prophylaxis: lovenox  TOTAL TIME TAKING CARE OF THIS PATIENT: *30 minutes.  >50% time spent on counselling and coordination of care  POSSIBLE D/C IN *1-2* DAYS, DEPENDING ON CLINICAL CONDITION.  Note: This dictation was prepared with Dragon dictation along with smaller phrase technology. Any transcriptional errors that result from this process are unintentional.  Fritzi Mandes M.D on 08/28/2018 at 11:00 AM  Between 7am to 6pm - Pager - [phone]  After 6pm go to [email or web address] - password [email or web address] [facility]  Office  [phone]  CC: Primary care physician; Marinda Elk, MDPatient ID: Melissa Peters, female   DOB: 27-Jun-1956, 63 y.o.   MRN: [id number]

## 2018-08-29 DIAGNOSIS — J441 Chronic obstructive pulmonary disease with (acute) exacerbation: Secondary | ICD-10-CM | POA: Diagnosis not present

## 2018-08-29 MED ORDER — CEFDINIR 300 MG PO CAPS
300.0000 mg | ORAL_CAPSULE | Freq: Two times a day (BID) | ORAL | Status: DC
  Administered 2018-08-29 – 2018-08-30 (×3): 300 mg via ORAL
  Filled 2018-08-29 (×4): qty 1

## 2018-08-29 MED ORDER — GUAIFENESIN ER 600 MG PO TB12
600.0000 mg | ORAL_TABLET | Freq: Two times a day (BID) | ORAL | Status: DC
  Administered 2018-08-29 – 2018-08-30 (×3): 600 mg via ORAL
  Filled 2018-08-29 (×3): qty 1

## 2018-08-29 MED ORDER — DEXTROMETHORPHAN POLISTIREX ER 30 MG/5ML PO SUER
30.0000 mg | Freq: Two times a day (BID) | ORAL | Status: DC
  Administered 2018-08-29 – 2018-08-30 (×3): 30 mg via ORAL
  Filled 2018-08-29 (×4): qty 5

## 2018-08-29 NOTE — Progress Notes (Signed)
[facility] [place] NAME: Melissa Peters    MR#:  [id number]  DATE OF BIRTH:  Aug 09, 1955  SUBJECTIVE:  patient feels week Denies any chest pain. Some shortness of breath on exertion. No fever. Poor appetite  REVIEW OF SYSTEMS:   Review of Systems  Constitutional: Positive for malaise/fatigue. Negative for chills, fever and weight loss.  HENT: Negative for ear discharge, ear pain and nosebleeds.   Eyes: Negative for blurred vision, pain and discharge.  Respiratory: Positive for cough and shortness of breath. Negative for sputum production, wheezing and stridor.   Cardiovascular: Negative for chest pain, palpitations, orthopnea and PND.  Gastrointestinal: Negative for abdominal pain, diarrhea, nausea and vomiting.  Genitourinary: Negative for frequency and urgency.  Musculoskeletal: Negative for back pain and joint pain.  Neurological: Positive for weakness. Negative for sensory change, speech change and focal weakness.  Psychiatric/Behavioral: Negative for depression and hallucinations. The patient is not nervous/anxious.    Tolerating Diet:very little Tolerating PT: ambulatory  DRUG ALLERGIES:  No Known Allergies  VITALS:  Blood pressure 122/67, pulse 63, temperature 97.7 F (36.5 C), temperature source Oral, resp. rate 18, height 5\' 6"  (1.676 m), weight 56.2 kg, SpO2 96 %.  PHYSICAL EXAMINATION:   Physical Exam  GENERAL:  63 y.o.-year-old patient lying in the bed with no acute distress. Thin, cachectic EYES: Pupils equal, round, reactive to light and accommodation. No scleral icterus. Extraocular muscles intact.  HEENT: Head atraumatic, normocephalic. Oropharynx and nasopharynx clear.  NECK:  Supple, no jugular venous distention. No thyroid enlargement, no tenderness.  LUNGS: distant breath sounds bilaterally, no wheezing, rales, rhonchi. No use of accessory muscles of respiration.  CARDIOVASCULAR: S1, S2 normal. No murmurs,  rubs, or gallops.  ABDOMEN: Soft, nontender, nondistended. Bowel sounds present. No organomegaly or mass.  EXTREMITIES: No cyanosis, clubbing or edema b/l.    NEUROLOGIC: Cranial nerves II through XII are intact. No focal Motor or sensory deficits b/l.   PSYCHIATRIC:  patient is alert and oriented x 3.  SKIN: No obvious rash, lesion, or ulcer.   LABORATORY PANEL:  CBC Recent Labs  Lab 08/28/18 0309  WBC 7.6  HGB 13.4  HCT 40.3  PLT 147*    Chemistries  Recent Labs  Lab 08/28/18 0309  NA 138  K 3.7  CL 108  CO2 22  GLUCOSE 128*  BUN 22*  CREATININE 0.67  CALCIUM 8.5*   Cardiac Enzymes Recent Labs  Lab 08/28/18 1046  TROPONINI <0.03   RADIOLOGY:  Ct Angio Chest Pe W And/or Wo Contrast  Result Date: 08/27/2018 CLINICAL DATA:  Increasing shortness of breath EXAM: CT ANGIOGRAPHY CHEST WITH CONTRAST TECHNIQUE: Multidetector CT imaging of the chest was performed using the standard protocol during bolus administration of intravenous contrast. Multiplanar CT image reconstructions and MIPs were obtained to evaluate the vascular anatomy. CONTRAST:  72mL OMNIPAQUE 350 COMPARISON:  Plain film from earlier in the same day. FINDINGS: Cardiovascular: Thoracic aorta demonstrates no evidence of aneurysmal dilatation or dissection. No cardiac enlargement is seen. Mild coronary calcifications are noted. Pulmonary artery shows a normal branching pattern. No filling defect is identified to suggest pulmonary embolism. Mediastinum/Nodes: The esophagus is within normal limits. No sizable hilar or mediastinal adenopathy is noted. The thoracic inlet is within normal limits. Lungs/Pleura: Mild emphysematous changes are noted. Patchy infiltrates are identified bilaterally worst in the lingula and medial aspect of the right lower lobe but diffusely throughout both lungs. No focal mass lesion or sizable  effusion is noted. Upper Abdomen: Visualized upper abdomen shows a 3.9 cm hypodense lesion within the  right adrenal gland. A 3.7 x 1.4 cm lesion is noted within the left adrenal gland. Hypodensity is noted within the liver consistent with underlying cyst. Musculoskeletal: Degenerative changes of the thoracic spine are noted. Review of the MIP images confirms the above findings. IMPRESSION: No evidence of pulmonary emboli. Multifocal pneumonia worst in the lingula on the left. Follow-up imaging following appropriate therapy is recommended to assess for resolution. Bilateral adrenal lesions are noted. Statistically these likely represent adenomas although nonemergent MRI workup would be helpful when clinically able. Electronically Signed   By: Inez Catalina M.D.   On: 08/27/2018 12:49   ASSESSMENT AND PLAN:  63 year old female with a history of anxiety/depression and tobacco dependence who presents with shortness of breath and generalized fatigue.  1.  Acute hypoxic respiratory failure in the setting of community-acquired multilobar pneumonia and COPD exacerbation. CT chest shows emphysematous chest -patient did qualify for home oxygen. -Set up home nebulizer as well. Wean oxygen to room air as tolerated  2.  Community-acquired multilobar pneumonia:CT shows no PE. -Continue Rocephin and azithromycin (3 doses received)--change to po cefdinir -Follow-up  blood cultures negative -Patient would benefit from an outpatient follow-up chest x-ray in 4 weeks. -Incentive spirometer  3.  Anxiety/depression: Continue clonazepam as needed and Zoloft  4. Tobacco dependence: Patient is encouraged to quit smoking. Counseling was provided for 4 minutes.  5. Bilateral adrenal lesions are noted.  -Statistically these likely represent adenomas although nonemergent MRI workup would be helpful when clinically able-- will defer it to primary care physician  6. Elevated troponin from demand ischemia, likely-- no history of CAD. No other risk factors. FOLLOW tele and troponins. 0.09--0.03--0.03--0.09--trop  0.03 No cp other than with coughing  Patient overall improved. She wants to stay one more day. She is otherwise stable for discharge. Management plans discussed with the patientand they are in agreement.  CODE STATUS: full  DVT Prophylaxis: lovenox  TOTAL TIME TAKING CARE OF THIS PATIENT: *30 minutes.  >50% time spent on counselling and coordination of care  POSSIBLE D/C IN *1-2* DAYS, DEPENDING ON CLINICAL CONDITION.  Note: This dictation was prepared with Dragon dictation along with smaller phrase technology. Any transcriptional errors that result from this process are unintentional.  Fritzi Mandes M.D on 08/29/2018 at 11:20 AM  Between 7am to 6pm - Pager - [phone]  After 6pm go to [email or web address] - password [email or web address] [facility]  Office  [phone]  CC: Primary care physician; Marinda Elk, MDPatient ID: Janae Bridgeman, female   DOB: 09/02/1955, 63 y.o.   MRN: [id number]

## 2018-08-29 NOTE — Care Management (Signed)
Sent a secure email to Len Childs at [facility] Patient notifying that the patient needs Home O2 will fax the order and evaluation for O2  Once order received

## 2018-08-29 NOTE — Progress Notes (Signed)
SATURATION QUALIFICATIONS: (This note is used to comply with regulatory documentation for home oxygen)  Patient Saturations on Room Air at Rest =88%  Patient Saturations on Room Air while Ambulating =na%  Patient Saturations on na Liters of oxygen while Ambulating = na%  Please briefly explain why patient needs home oxygen:at rest while  On room air sats 87-88 %

## 2018-08-29 NOTE — Care Management (Signed)
Faxed Order and O2 eval to Len Childs with [facility] at [phone], notified Lennette Bihari via text that fax was sent

## 2018-08-30 LAB — HIV ANTIBODY (ROUTINE TESTING W REFLEX): HIV Screen 4th Generation wRfx: NONREACTIVE

## 2018-08-30 MED ORDER — TIOTROPIUM BROMIDE MONOHYDRATE 18 MCG IN CAPS: 18.0000 ug | ORAL_CAPSULE | Freq: Every day | RESPIRATORY_TRACT | 12 refills | Status: AC

## 2018-08-30 MED ORDER — IPRATROPIUM-ALBUTEROL 0.5-2.5 (3) MG/3ML IN SOLN: 3.0000 mL | RESPIRATORY_TRACT | 1 refills | Status: AC | PRN

## 2018-08-30 MED ORDER — CEFDINIR 300 MG PO CAPS: 300.0000 mg | ORAL_CAPSULE | Freq: Two times a day (BID) | ORAL | 0 refills | Status: AC

## 2018-08-30 MED ORDER — DEXTROMETHORPHAN POLISTIREX ER 30 MG/5ML PO SUER: 30.0000 mg | Freq: Two times a day (BID) | ORAL | 0 refills | Status: AC

## 2018-08-30 NOTE — Discharge Instructions (Signed)
Smoking cessation advised use your oxygen as instructed. Use your nebulizers as needed.

## 2018-08-30 NOTE — Progress Notes (Signed)
Pt for discharge home. A/o. 02 2l [place] . Instructions discussed with  Pt. presc meds discussed and home meds. Diet activity and f/u discussed.  Verbalizes understanding.  Out via w/c with dtr w/o c/o.

## 2018-08-30 NOTE — Plan of Care (Signed)

## 2018-08-30 NOTE — Discharge Summary (Signed)
[facility] at [place] NAME: Melissa Peters    MR#:  [id number]  DATE OF BIRTH:  May 16, 1956  DATE OF ADMISSION:  08/27/2018 ADMITTING PHYSICIAN: Kyung Rudd, MD  DATE OF DISCHARGE: 08/30/2018  PRIMARY CARE PHYSICIAN: Lynnell Jude, MD    ADMISSION DIAGNOSIS:  Chronic pneumonia [J18.9] Troponin I above reference range [R79.89]  DISCHARGE DIAGNOSIS:  acute hypoxic respiratory failure due to COPD exacerbation multifocal pneumonia chronic anxiety tobacco abuse bilateral adrenal lesion--- defer to primary care physician for follow-up SECONDARY DIAGNOSIS:   Past Medical History:  Diagnosis Date  . Anxiety   . Depression     HOSPITAL COURSE:  63 year old female with a history of anxiety/depression and tobacco dependence who presents with shortness of breath and generalized fatigue.  1. Acute hypoxic respiratory failure in the setting of community-acquired multilobar pneumonia and COPD exacerbation. CT chest shows emphysematous chest -patient did qualify for home oxygen. -Set up home nebulizer as well.  2. Community-acquired multilobar pneumonia:CT shows no PE. -Received Rocephin and azithromycin (3 doses received)--change to po cefdinir five more days -Follow-up  blood cultures negative -Patient would benefit from an outpatient follow-up chest x-ray in 4 weeks. -Incentive spirometer  3. Anxiety/depression: Continue clonazepam as needed and Zoloft  4.Tobacco dependence: Patient is encouraged to quit smoking. Counseling was provided for 4 minutes.  5.Bilateral adrenal lesions are noted.  -Statistically these likely represent adenomas although nonemergent MRI workup would be helpful when clinically able-- will defer it to primary care physician  6. Elevated troponin from demand ischemia, likely-- no history of CAD. No other risk factors. FOLLOW tele and troponins. 0.09--0.03--0.03--0.09--trop 0.03 No cp other than  with coughing  Patient overall improved. She is otherwise stable for discharge. CONSULTS OBTAINED:    DRUG ALLERGIES:  No Known Allergies  DISCHARGE MEDICATIONS:   Allergies as of 08/30/2018   No Known Allergies     Medication List    TAKE these medications   albuterol 108 (90 Base) MCG/ACT inhaler Commonly known as:  PROVENTIL HFA;VENTOLIN HFA Inhale 2 puffs into the lungs every 4 (four) hours as needed for wheezing.   cefdinir 300 MG capsule Commonly known as:  OMNICEF Take 1 capsule (300 mg total) by mouth every 12 (twelve) hours.   clonazePAM 0.5 MG tablet Commonly known as:  KLONOPIN Take 0.5 mg by mouth 2 (two) times daily as needed for anxiety.   dextromethorphan 30 MG/5ML liquid Commonly known as:  DELSYM Take 5 mLs (30 mg total) by mouth 2 (two) times daily.   ERGOCALCIFEROL PO Take 1 tablet by mouth daily.   ipratropium-albuterol 0.5-2.5 (3) MG/3ML Soln Commonly known as:  DUONEB Take 3 mLs by nebulization every 4 (four) hours as needed.   multivitamin tablet Take 1 tablet by mouth daily.   naproxen 500 MG tablet Commonly known as:  NAPROSYN Take 500 mg by mouth 2 (two) times daily with a meal.   oxymetazoline 0.05 % nasal spray Commonly known as:  AFRIN Place 1 spray into both nostrils 2 (two) times daily.   sertraline 100 MG tablet Commonly known as:  ZOLOFT Take 200 mg by mouth daily.   tiotropium 18 MCG inhalation capsule Commonly known as:  SPIRIVA Place 1 capsule (18 mcg total) into inhaler and inhale daily. Start taking on:  August 31, 2018            Durable Medical Equipment  (From admission, onward)         Start  Ordered   08/29/18 1124  For home use only DME Nebulizer machine  Once    Question:  Patient needs a nebulizer to treat with the following condition  Answer:  COPD (chronic obstructive pulmonary disease) ([place])   08/29/18 1124   08/29/18 1114  For home use only DME oxygen  Once    Question Answer Comment  Mode  or (Route) Nasal cannula   Liters per Minute 2   Frequency Continuous (stationary and portable oxygen unit needed)   Oxygen conserving device Yes   Oxygen delivery system Gas      08/29/18 1114          If you experience worsening of your admission symptoms, develop shortness of breath, life threatening emergency, suicidal or homicidal thoughts you must seek medical attention immediately by calling 911 or calling your MD immediately  if symptoms less severe.  You Must read complete instructions/literature along with all the possible adverse reactions/side effects for all the Medicines you take and that have been prescribed to you. Take any new Medicines after you have completely understood and accept all the possible adverse reactions/side effects.   Please note  You were cared for by a hospitalist during your hospital stay. If you have any questions about your discharge medications or the care you received while you were in the hospital after you are discharged, you can call the unit and asked to speak with the hospitalist on call if the hospitalist that took care of you is not available. Once you are discharged, your primary care physician will handle any further medical issues. Please note that NO REFILLS for any discharge medications will be authorized once you are discharged, as it is imperative that you return to your primary care physician (or establish a relationship with a primary care physician if you do not have one) for your aftercare needs so that they can reassess your need for medications and monitor your lab values. Today   SUBJECTIVE  no new complaints. Feels weak   VITAL SIGNS:  Blood pressure 105/69, pulse 65, temperature 98.8 F (37.1 C), temperature source Oral, resp. rate 18, height 5\' 6"  (1.676 m), weight 57.2 kg, SpO2 93 %.  I/O:    Intake/Output Summary (Last 24 hours) at 08/30/2018 1138 Last data filed at 08/29/2018 1906 Gross per 24 hour  Intake 20 ml   Output -  Net 20 ml    PHYSICAL EXAMINATION:  GENERAL:  63 y.o.-year-old patient lying in the bed with no acute distress. Thin cachectic EYES: Pupils equal, round, reactive to light and accommodation. No scleral icterus. Extraocular muscles intact.  HEENT: Head atraumatic, normocephalic. Oropharynx and nasopharynx clear.  NECK:  Supple, no jugular venous distention. No thyroid enlargement, no tenderness.  LUNGS: distant breath sounds bilaterally, no wheezing, rales,rhonchi or crepitation. No use of accessory muscles of respiration.  CARDIOVASCULAR: S1, S2 normal. No murmurs, rubs, or gallops.  ABDOMEN: Soft, non-tender, non-distended. Bowel sounds present. No organomegaly or mass.  EXTREMITIES: No pedal edema, cyanosis, or clubbing.  NEUROLOGIC: Cranial nerves II through XII are intact. Muscle strength 5/5 in all extremities. Sensation intact. Gait not checked.  PSYCHIATRIC: The patient is alert and oriented x 3.  SKIN: No obvious rash, lesion, or ulcer.   DATA REVIEW:   CBC  Recent Labs  Lab 08/28/18 0309  WBC 7.6  HGB 13.4  HCT 40.3  PLT 147*    Chemistries  Recent Labs  Lab 08/28/18 0309  NA 138  K 3.7  CL 108  CO2 22  GLUCOSE 128*  BUN 22*  CREATININE 0.67  CALCIUM 8.5*    Microbiology Results   Recent Results (from the past 240 hour(s))  Rapid Influenza A&B Antigens ([facility] only)     Status: None   Collection Time: 08/27/18 10:06 AM  Result Value Ref Range Status   Influenza A ([facility]) NEGATIVE NEGATIVE Final   Influenza B ([facility]) NEGATIVE NEGATIVE Final    Comment: Performed at [facility], [address] [place], [place] [address]  Culture, blood (routine x 2)     Status: None (Preliminary result)   Collection Time: 08/27/18 12:03 PM  Result Value Ref Range Status   Specimen Description BLOOD BLOOD RIGHT WRIST  Final   Special Requests   Final    BOTTLES DRAWN AEROBIC AND ANAEROBIC Blood Culture results may not be optimal due to an excessive  volume of blood received in culture bottles   Culture   Final    NO GROWTH 3 DAYS Performed at [facility], [address], [place], [place] [address]    Report Status PENDING  Incomplete  Culture, blood (routine x 2)     Status: None (Preliminary result)   Collection Time: 08/27/18 12:04 PM  Result Value Ref Range Status   Specimen Description BLOOD BLOOD LEFT FOREARM  Final   Special Requests   Final    BOTTLES DRAWN AEROBIC AND ANAEROBIC Blood Culture adequate volume   Culture   Final    NO GROWTH 3 DAYS Performed at [facility], [address], [place], [place] [address]    Report Status PENDING  Incomplete    RADIOLOGY:  No results found.   CODE STATUS:     Code Status Orders  (From admission, onward)         Start     Ordered   08/27/18 1305  Full code  Continuous     08/27/18 1306        Code Status History    This patient has a current code status but no historical code status.      TOTAL TIME TAKING CARE OF THIS PATIENT: *40* minutes.    Fritzi Mandes M.D on 08/30/2018 at 11:38 AM  Between 7am to 6pm - Pager - [phone] After 6pm go to [email or web address] - password [email or web address] [place] [facility]  Office  [phone]  CC: Primary care physician; Lynnell Jude, MD

## 2018-08-30 NOTE — Care Management Note (Signed)
Case Management Note  Patient Details  Name: Melissa Peters MRN: [id number] Date of Birth: 07-14-1955  Subjective/Objective:                 [place] Patient  Agency to notify of discharge and the new order for home nebulizer. Asked that CM be notified asap if does not receive the fax.  Patient is going to discharge to the home of her daughter [address], [place]. Lennette Bihari the sales representative will notify the office of address change. The nebulizer will be delivered with the concentrator.  The portable oxygen is in the room.  Action/Plan:   Expected Discharge Date:  08/30/18               Expected Discharge Plan:     In-House Referral:     Discharge planning Services     Post Acute Care Choice:  Durable Medical Equipment Choice offered to:  Patient  DME Arranged:  Nebulizer machine, Oxygen DME Agency:  ([place])  [place] Arranged:    [place] Agency:     Status of Service:  Completed, signed off  If discussed at [employer] of Stay Meetings, dates discussed:    Additional Comments:  Katrina Stack, RN 08/30/2018, 4:10 PM

## 2018-08-30 NOTE — Progress Notes (Signed)
SATURATION QUALIFICATIONS: (This note is used to comply with regulatory documentation for home oxygen)  Patient Saturations on Room Air at Rest = 92%  Patient Saturations on Room Air while Ambulating = 88%  Patient Saturations on 2 Liters of oxygen while Ambulating = 95%  Please briefly explain why patient needs home oxygen:pt drops to 88% while amb on r/a

## 2018-09-01 LAB — CULTURE, BLOOD (ROUTINE X 2)
Culture: NO GROWTH
Culture: NO GROWTH
Special Requests: ADEQUATE

## 2019-05-18 IMAGING — CT CT ANGIO CHEST
2 of 6 series · 18 of 46 positions shown · IV contrast (APPLIED)
Comparison: Plain film from earlier in the same day.

CLINICAL DATA: Increasing shortness of breath

EXAM:
CT ANGIOGRAPHY CHEST WITH CONTRAST
TECHNIQUE: Multidetector CT imaging of the chest was performed using the
standard protocol during bolus administration of intravenous
contrast. Multiplanar CT image reconstructions and MIPs were
obtained to evaluate the vascular anatomy.
CONTRAST:  75mL OMNIPAQUE 350

[Series 5: thins · axial · 0.60mm/px · z∈[-354,-66]mm · 15 of 316 slices shown]
[im 14/316  lung]
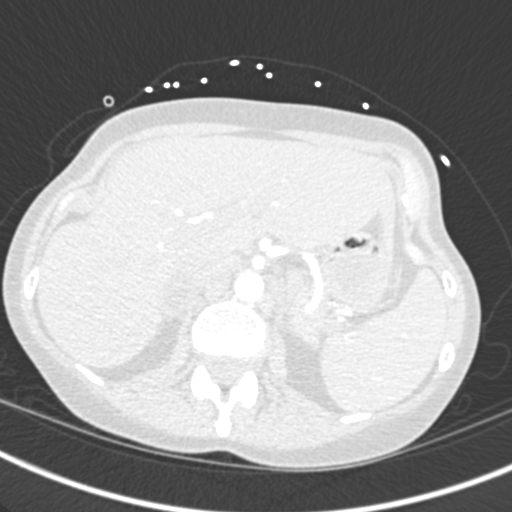
[im 42/316  soft-tissue]
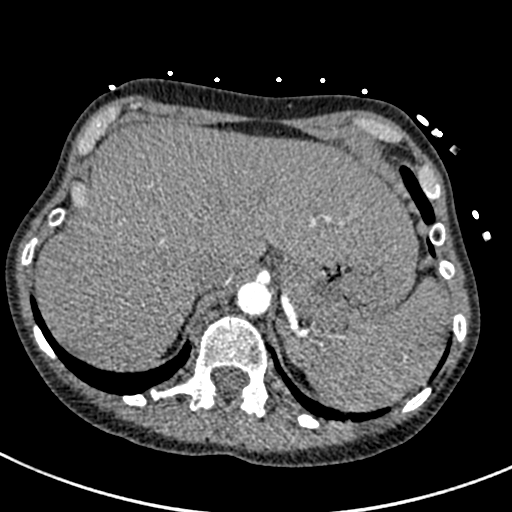
[im 55/316  lung]
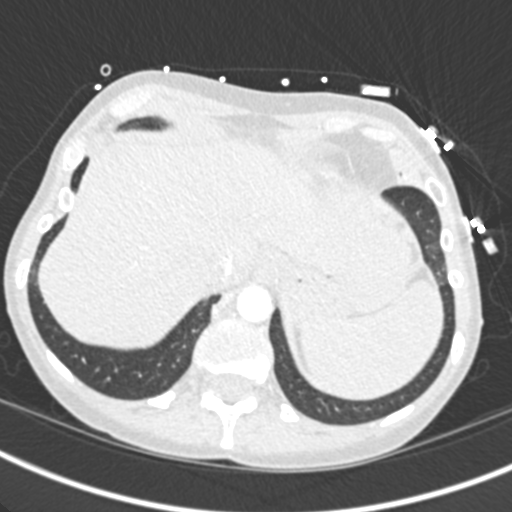
[im 83/316  soft-tissue]
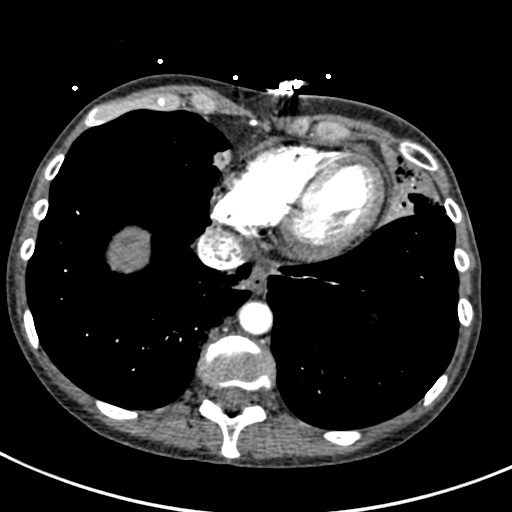
[im 96/316  lung]
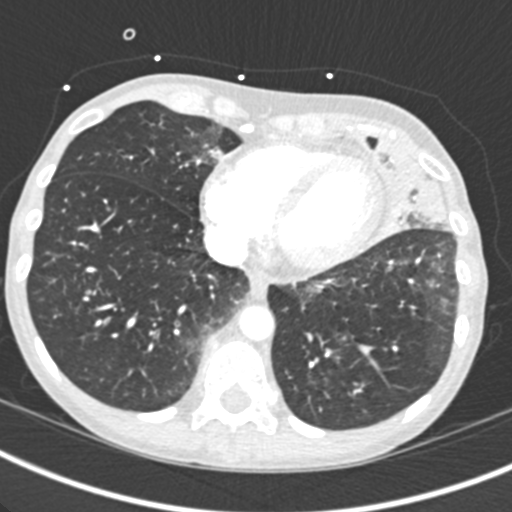
[im 124/316  soft-tissue]
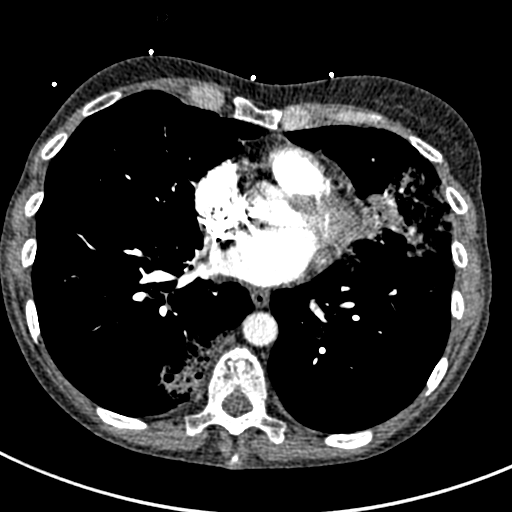
[im 137/316  lung]
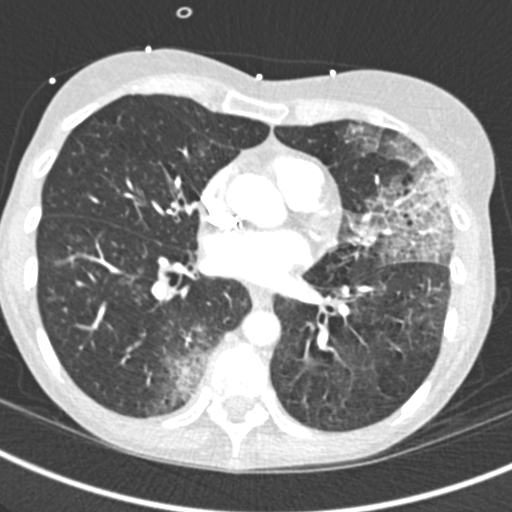
[im 165/316  soft-tissue]
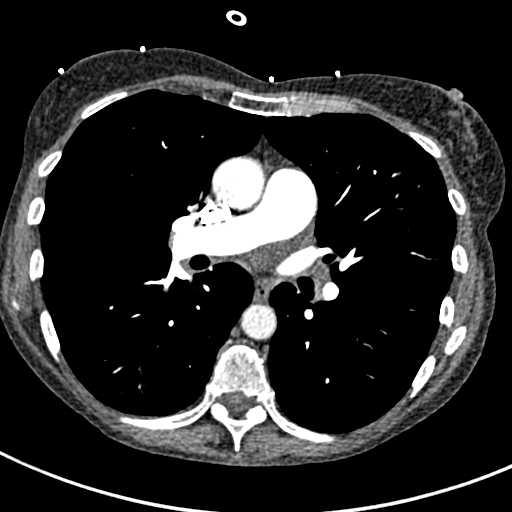
[im 179/316  lung]
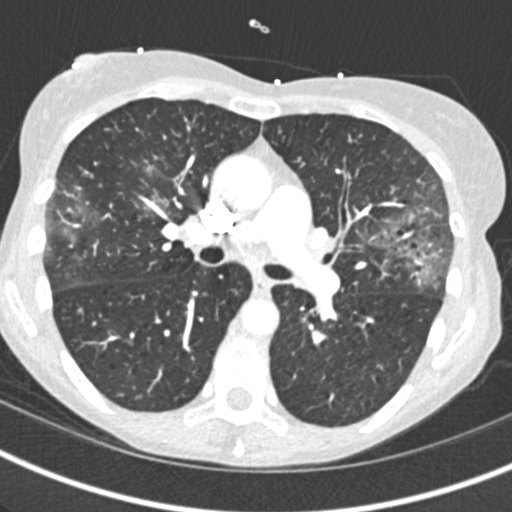
[im 192/316  soft-tissue]
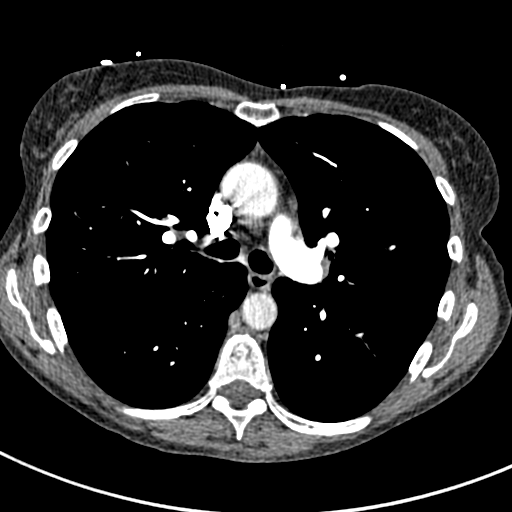
[im 220/316  lung]
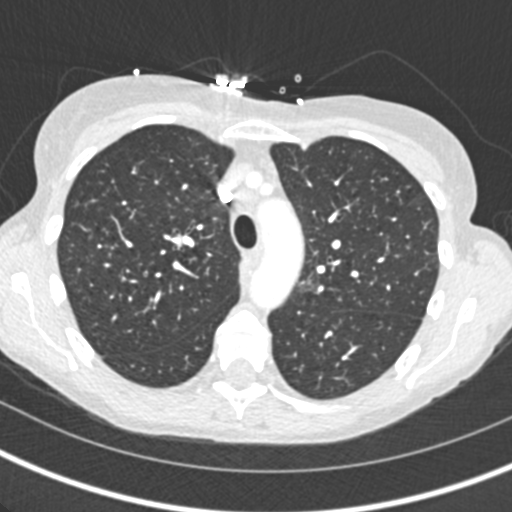
[im 233/316  soft-tissue]
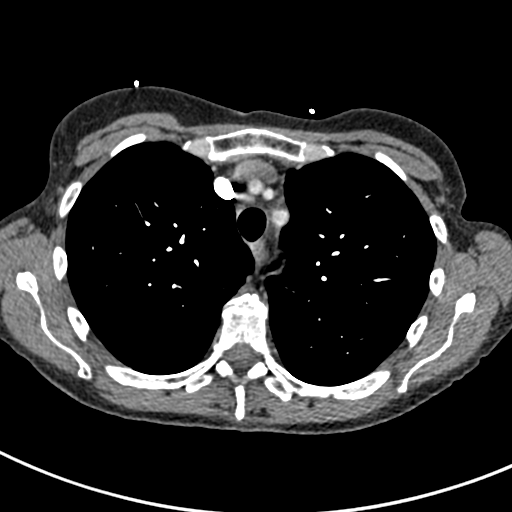
[im 261/316  lung]
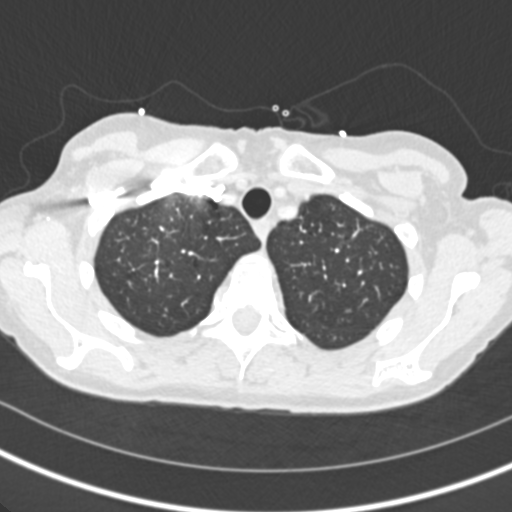
[im 274/316  soft-tissue]
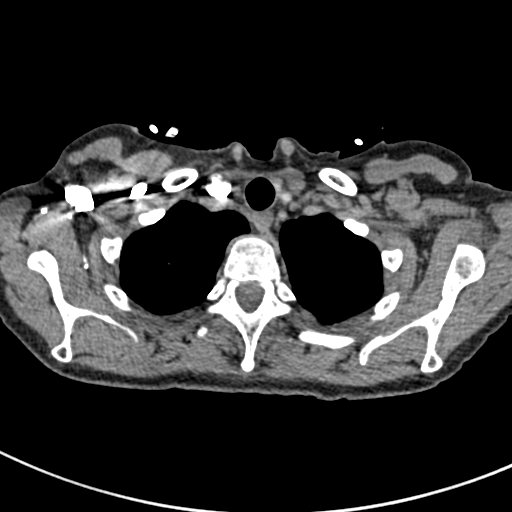
[im 302/316  lung]
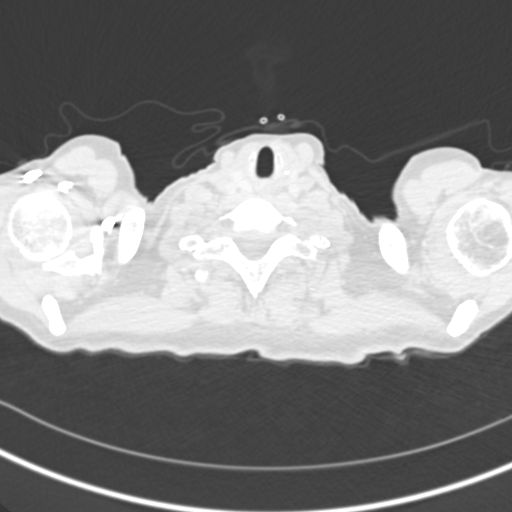

[Series 7: coronal mpr · coronal · 0.61mm/px · 3 of 79 slices shown]
[im 20/79  soft-tissue]
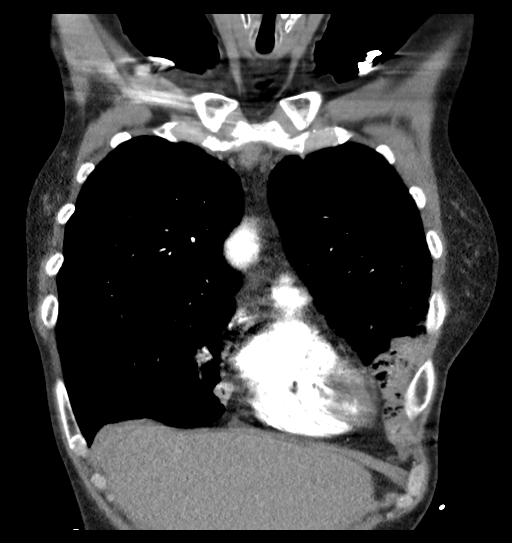
[im 40/79  soft-tissue]
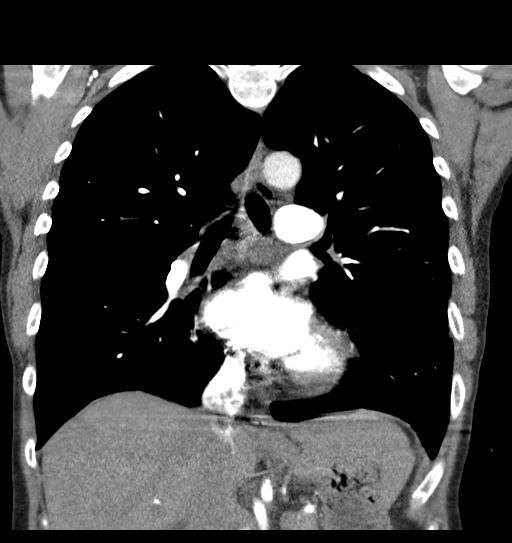
[im 59/79  soft-tissue]
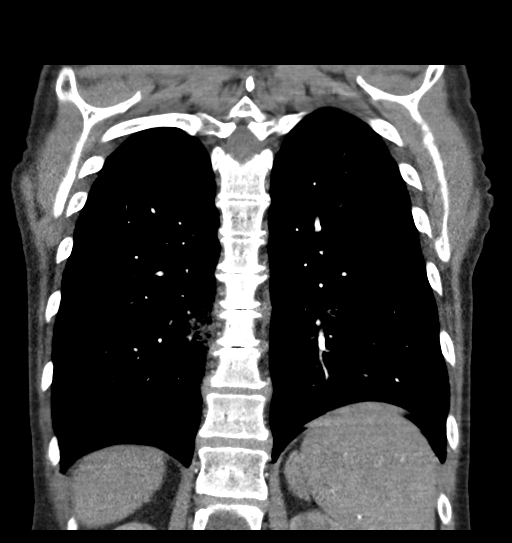

[18 of 46 positions shown; findings below may reference images not displayed]

FINDINGS: Cardiovascular: Thoracic aorta demonstrates no evidence of
aneurysmal dilatation or dissection. No cardiac enlargement is seen.
Mild coronary calcifications are noted. Pulmonary artery shows a
normal branching pattern. No filling defect is identified to suggest
pulmonary embolism.

Mediastinum/Nodes: The esophagus is within normal limits. No sizable
hilar or mediastinal adenopathy is noted. The thoracic inlet is
within normal limits.

Lungs/Pleura: Mild emphysematous changes are noted. Patchy
infiltrates are identified bilaterally worst in the lingula and
medial aspect of the right lower lobe but diffusely throughout both
lungs. No focal mass lesion or sizable effusion is noted.

Upper Abdomen: Visualized upper abdomen shows a 3.9 cm hypodense
lesion within the right adrenal gland. A 3.7 x 1.4 cm lesion is
noted within the left adrenal gland. Hypodensity is noted within the
liver consistent with underlying cyst.

Musculoskeletal: Degenerative changes of the thoracic spine are
noted.

Review of the MIP images confirms the above findings.
IMPRESSION: No evidence of pulmonary emboli.

Multifocal pneumonia worst in the lingula on the left. Follow-up
imaging following appropriate therapy is recommended to assess for
resolution.

Bilateral adrenal lesions are noted. Statistically these likely
represent adenomas although nonemergent MRI workup would be helpful
when clinically able.

## 2020-05-14 ENCOUNTER — Other Ambulatory Visit: Payer: Self-pay | Admitting: Family Medicine

## 2020-05-14 DIAGNOSIS — Z1231 Encounter for screening mammogram for malignant neoplasm of breast: Secondary | ICD-10-CM

## 2020-07-04 ENCOUNTER — Ambulatory Visit: Admission: RE | Admit: 2020-07-04 | Payer: BLUE CROSS/BLUE SHIELD | Source: Ambulatory Visit

## 2020-07-04 ENCOUNTER — Other Ambulatory Visit: Payer: Self-pay

## 2024-05-16 ENCOUNTER — Other Ambulatory Visit: Payer: Self-pay | Admitting: Family Medicine

## 2024-05-16 DIAGNOSIS — Z13820 Encounter for screening for osteoporosis: Secondary | ICD-10-CM

## 2024-05-16 DIAGNOSIS — Z780 Asymptomatic menopausal state: Secondary | ICD-10-CM
